# Patient Record
Sex: Female | Born: 1970 | Race: White | Hispanic: No | Marital: Married | Smoking: Never smoker
Health system: Southern US, Community
[De-identification: ages and names within clinical notes are randomized; demographics above are authoritative.]

## PROBLEM LIST (undated history)

## (undated) DIAGNOSIS — J189 Pneumonia, unspecified organism: Secondary | ICD-10-CM

## (undated) DIAGNOSIS — D649 Anemia, unspecified: Secondary | ICD-10-CM

## (undated) DIAGNOSIS — D219 Benign neoplasm of connective and other soft tissue, unspecified: Secondary | ICD-10-CM

## (undated) HISTORY — DX: Benign neoplasm of connective and other soft tissue, unspecified: D21.9

## (undated) HISTORY — DX: Anemia, unspecified: D64.9

## (undated) HISTORY — PX: LAPAROSCOPIC TUBAL LIGATION: SUR803

---

## 1996-01-17 ENCOUNTER — Encounter (INDEPENDENT_AMBULATORY_CARE_PROVIDER_SITE_OTHER): Payer: Self-pay | Admitting: *Deleted

## 1996-01-17 LAB — CONVERTED CEMR LAB

## 2000-03-11 ENCOUNTER — Encounter: Admission: RE | Admit: 2000-03-11 | Discharge: 2000-03-11 | Payer: Self-pay | Admitting: Family Medicine

## 2000-03-26 ENCOUNTER — Encounter: Admission: RE | Admit: 2000-03-26 | Discharge: 2000-03-26 | Payer: Self-pay | Admitting: Family Medicine

## 2000-04-02 ENCOUNTER — Encounter: Admission: RE | Admit: 2000-04-02 | Discharge: 2000-04-02 | Payer: Self-pay | Admitting: Family Medicine

## 2000-09-01 ENCOUNTER — Encounter: Admission: RE | Admit: 2000-09-01 | Discharge: 2000-09-01 | Payer: Self-pay | Admitting: Family Medicine

## 2000-09-16 ENCOUNTER — Encounter: Admission: RE | Admit: 2000-09-16 | Discharge: 2000-09-16 | Payer: Self-pay | Admitting: Family Medicine

## 2003-08-28 ENCOUNTER — Encounter: Payer: Self-pay | Admitting: Emergency Medicine

## 2003-08-28 ENCOUNTER — Emergency Department (HOSPITAL_COMMUNITY): Admission: EM | Admit: 2003-08-28 | Discharge: 2003-08-28 | Payer: Self-pay | Admitting: Emergency Medicine

## 2005-08-05 ENCOUNTER — Ambulatory Visit: Payer: Self-pay | Admitting: Family Medicine

## 2007-01-16 ENCOUNTER — Encounter (INDEPENDENT_AMBULATORY_CARE_PROVIDER_SITE_OTHER): Payer: Self-pay | Admitting: *Deleted

## 2007-11-19 ENCOUNTER — Encounter: Payer: Self-pay | Admitting: Family Medicine

## 2008-11-09 ENCOUNTER — Emergency Department (HOSPITAL_COMMUNITY): Admission: EM | Admit: 2008-11-09 | Discharge: 2008-11-09 | Payer: Self-pay | Admitting: Emergency Medicine

## 2010-12-08 ENCOUNTER — Encounter: Payer: Self-pay | Admitting: Family Medicine

## 2010-12-20 NOTE — Letter (Signed)
Summary: rpc chart  rpc chart   Imported By: Curtis Sites 08/20/2010 09:51:32  _____________________________________________________________________  External Attachment:    Type:   Image     Comment:   External Document

## 2011-07-30 ENCOUNTER — Encounter: Payer: Self-pay | Admitting: *Deleted

## 2011-07-30 ENCOUNTER — Emergency Department (HOSPITAL_COMMUNITY)
Admission: EM | Admit: 2011-07-30 | Discharge: 2011-07-30 | Discharge status: Against Medical Advice | Payer: Self-pay | Attending: Emergency Medicine | Admitting: Emergency Medicine

## 2011-07-30 DIAGNOSIS — R509 Fever, unspecified: Secondary | ICD-10-CM | POA: Insufficient documentation

## 2011-07-30 DIAGNOSIS — Z5320 Procedure and treatment not carried out because of patient's decision for unspecified reasons: Secondary | ICD-10-CM | POA: Insufficient documentation

## 2011-07-30 HISTORY — DX: Pneumonia, unspecified organism: J18.9

## 2011-07-30 NOTE — ED Notes (Signed)
Unable to locate pt  

## 2011-07-30 NOTE — ED Notes (Signed)
Alert, talking, body aches. No cough or sore throat, Has been taking aleve for aching, face flushed.  Chills intermittently today.

## 2011-07-30 NOTE — ED Notes (Signed)
Pt not in room.

## 2011-08-23 LAB — URINALYSIS, ROUTINE W REFLEX MICROSCOPIC
Bilirubin Urine: NEGATIVE
Glucose, UA: NEGATIVE mg/dL
Ketones, ur: NEGATIVE mg/dL
Leukocytes, UA: NEGATIVE
Nitrite: NEGATIVE
Protein, ur: NEGATIVE mg/dL
Specific Gravity, Urine: 1.01 (ref 1.005–1.030)
Urobilinogen, UA: 0.2 mg/dL (ref 0.0–1.0)
pH: 5 (ref 5.0–8.0)

## 2011-08-23 LAB — PREGNANCY, URINE: Preg Test, Ur: NEGATIVE

## 2011-08-23 LAB — URINE MICROSCOPIC-ADD ON

## 2011-11-19 DIAGNOSIS — J189 Pneumonia, unspecified organism: Secondary | ICD-10-CM

## 2011-11-19 HISTORY — DX: Pneumonia, unspecified organism: J18.9

## 2016-12-12 ENCOUNTER — Emergency Department (HOSPITAL_COMMUNITY): Payer: Self-pay

## 2016-12-12 ENCOUNTER — Emergency Department (HOSPITAL_COMMUNITY)
Admission: EM | Admit: 2016-12-12 | Discharge: 2016-12-12 | Disposition: A | Discharge status: Home / Self Care | Payer: Self-pay | Attending: Emergency Medicine | Admitting: Emergency Medicine

## 2016-12-12 ENCOUNTER — Encounter (HOSPITAL_COMMUNITY): Payer: Self-pay

## 2016-12-12 DIAGNOSIS — Y9389 Activity, other specified: Secondary | ICD-10-CM | POA: Insufficient documentation

## 2016-12-12 DIAGNOSIS — W0110XA Fall on same level from slipping, tripping and stumbling with subsequent striking against unspecified object, initial encounter: Secondary | ICD-10-CM | POA: Insufficient documentation

## 2016-12-12 DIAGNOSIS — Y92009 Unspecified place in unspecified non-institutional (private) residence as the place of occurrence of the external cause: Secondary | ICD-10-CM | POA: Insufficient documentation

## 2016-12-12 DIAGNOSIS — S0101XA Laceration without foreign body of scalp, initial encounter: Secondary | ICD-10-CM | POA: Insufficient documentation

## 2016-12-12 DIAGNOSIS — Z23 Encounter for immunization: Secondary | ICD-10-CM | POA: Insufficient documentation

## 2016-12-12 DIAGNOSIS — Y999 Unspecified external cause status: Secondary | ICD-10-CM | POA: Insufficient documentation

## 2016-12-12 DIAGNOSIS — S0990XA Unspecified injury of head, initial encounter: Secondary | ICD-10-CM

## 2016-12-12 DIAGNOSIS — Z791 Long term (current) use of non-steroidal anti-inflammatories (NSAID): Secondary | ICD-10-CM | POA: Insufficient documentation

## 2016-12-12 DIAGNOSIS — S060X1A Concussion with loss of consciousness of 30 minutes or less, initial encounter: Secondary | ICD-10-CM | POA: Insufficient documentation

## 2016-12-12 MED ORDER — LIDOCAINE HCL (PF) 1 % IJ SOLN
5.0000 mL | Freq: Once | INTRAMUSCULAR | Status: DC
  Filled 2016-12-12: qty 5

## 2016-12-12 MED ORDER — TETANUS-DIPHTH-ACELL PERTUSSIS 5-2.5-18.5 LF-MCG/0.5 IM SUSP
0.5000 mL | Freq: Once | INTRAMUSCULAR | Status: AC
  Administered 2016-12-12: 0.5 mL via INTRAMUSCULAR
  Filled 2016-12-12: qty 0.5

## 2016-12-12 MED ORDER — ONDANSETRON HCL 4 MG PO TABS: 4.0000 mg | ORAL_TABLET | Freq: Four times a day (QID) | ORAL | 0 refills | Status: DC

## 2016-12-12 NOTE — ED Provider Notes (Signed)
Emergency Department Provider Note   I have reviewed the triage vital signs and the nursing notes.   HISTORY  Chief Complaint Fall and Head Injury  HPI: Glenda Ray is a 46 y.o. female who presents to the Emergency Department complaining of a sudden-onset, head injury s/p mechanical fall that happened today. Patient states she was taking her trash can around the corner after cleaning it out, in which she slipped and fell into the trash can, the lid came flying. Patient reports hitting her head blacking out for a few seconds. patient states she went up to go to the house when her son noticed she was bleeding. Patient has associated wrist/knee pain, and dizziness. No modifying factors indicated. She denies any nausea or any other symptoms.    Past Medical History:  Diagnosis Date  . Pneumonia     There are no active problems to display for this patient.   Past Surgical History:  Procedure Laterality Date  . LAPAROSCOPIC TUBAL LIGATION        Allergies Amoxicillin and Vicodin [hydrocodone-acetaminophen]  No family history on file.  Social History Social History  Substance Use Topics  . Smoking status: Never Smoker  . Smokeless tobacco: Never Used  . Alcohol use No    Review of Systems Constitutional: No fever/chills Eyes: No visual changes. ENT: No sore throat. Cardiovascular: Denies chest pain. Respiratory: Denies shortness of breath. Gastrointestinal: No abdominal pain.  No nausea, no vomiting.  No diarrhea.  No constipation. Genitourinary: Negative for dysuria. Musculoskeletal: Negative for back pain. Skin: Negative for rash. Neurological: Negative for headaches, focal weakness or numbness. Positive for dizziness.   10-point ROS otherwise negative.  ____________________________________________   PHYSICAL EXAM:  VITAL SIGNS: ED Triage Vitals  Enc Vitals Group     BP 12/12/16 1307 142/76     Pulse Rate 12/12/16 1307 99     Resp 12/12/16 1307 18       Temp 12/12/16 1307 98.2 F (36.8 C)     Temp Source 12/12/16 1307 Oral     SpO2 12/12/16 1307 100 %     Weight 12/12/16 1304 153 lb (69.4 kg)     Height 12/12/16 1304 5\' 2"  (1.575 m)     Pain Score 12/12/16 1304 2    Constitutional: Alert and oriented. Well appearing and in no acute distress. Eyes: Conjunctivae are normal. PERRL. EOMI. Head: 3 cm laceration to right forehead in the hairline. Ears:  Healthy appearing ear canals and TMs bilaterally Nose: No congestion/rhinnorhea. Mouth/Throat: Mucous membranes are moist.  Oropharynx non-erythematous. Neck: No stridor. No cervical spine tenderness to palpation. Cardiovascular: Normal rate, regular rhythm. Good peripheral circulation. Grossly normal heart sounds.   Respiratory: Normal respiratory effort.  No retractions. Lungs CTAB. Gastrointestinal: Soft and nontender. No distention.  Musculoskeletal: No lower extremity tenderness nor edema. No gross deformities of extremities. Neurologic:  Normal speech and language. No gross focal neurologic deficits are appreciated.  Skin:  Skin is warm, dry and intact. No rash noted. Psychiatric: Mood and affect are normal. Speech and behavior are normal.  ____________________________________________  RADIOLOGY  Ct Head Wo Contrast  Result Date: 12/12/2016 CLINICAL DATA:  Fall, hit head. EXAM: CT HEAD WITHOUT CONTRAST TECHNIQUE: Contiguous axial images were obtained from the base of the skull through the vertex without intravenous contrast. COMPARISON:  None. FINDINGS: Brain: No acute intracranial abnormality. Specifically, no hemorrhage, hydrocephalus, mass lesion, acute infarction, or significant intracranial injury. Vascular: No hyperdense vessel or unexpected calcification. Skull: No acute  calvarial abnormality. Sinuses/Orbits: Visualized paranasal sinuses and mastoids clear. Orbital soft tissues unremarkable. Other: None IMPRESSION: Normal study. Electronically Signed   By: Rolm Baptise M.D.    On: 12/12/2016 14:18    ____________________________________________   PROCEDURES  Procedure(s) performed:   Marland KitchenMarland KitchenLaceration Repair Date/Time: 12/12/2016 9:34 PM Performed by: Girolamo Lortie, Wonda Olds Authorized by: Margette Fast   Consent:    Consent obtained:  Verbal   Consent given by:  Patient   Risks discussed:  Infection, pain, retained foreign body, poor cosmetic result, poor wound healing, need for additional repair, nerve damage and vascular damage   Alternatives discussed:  No treatment Anesthesia (see MAR for exact dosages):    Anesthesia method:  Local infiltration   Local anesthetic:  Lidocaine 1% w/o epi Laceration details:    Location:  Scalp   Scalp location:  R temporal   Length (cm):  3 Repair type:    Repair type:  Simple Pre-procedure details:    Preparation:  Patient was prepped and draped in usual sterile fashion Exploration:    Hemostasis achieved with:  Direct pressure   Wound exploration: entire depth of wound probed and visualized     Wound extent: no foreign bodies/material noted, no muscle damage noted, no nerve damage noted, no underlying fracture noted and no vascular damage noted     Contaminated: no   Treatment:    Area cleansed with:  Betadine   Amount of cleaning:  Standard   Irrigation solution:  Sterile saline   Visualized foreign bodies/material removed: no   Skin repair:    Repair method:  Staples   Number of staples:  2 Approximation:    Approximation:  Close   Vermilion border: well-aligned   Post-procedure details:    Dressing:  Open (no dressing)   Patient tolerance of procedure:  Tolerated well, no immediate complications    ____________________________________________   INITIAL IMPRESSION / ASSESSMENT AND PLAN / ED COURSE  Pertinent labs & imaging results that were available during my care of the patient were reviewed by me and considered in my medical decision making (see chart for details).  Patient resents to the  emergency department for evaluation of mechanical fall at home. She struck her head and a brief period of LOC. Initially had some pain in the right wrist, elbow, knee but these have resolved. The patient has full range of motion of the extremities with no obvious deformity. Plan for CT of the head. She also has a laceration to the right side of the scalp which may require suture/staples. Will update tetanus. Patient with no focal neurological deficits or clinical signs or symptoms to suggest basilar skull fracture.  Normal CT head. Laceration repair as above. Updated tetanus. Discharge home with concussion care plan at home and return precautions. Will return in 7 days for suture removal.   At this time, I do not feel there is any life-threatening condition present. I have reviewed and discussed all results (EKG, imaging, lab, urine as appropriate), exam findings with patient. I have reviewed nursing notes and appropriate previous records.  I feel the patient is safe to be discharged home without further emergent workup. Discussed usual and customary return precautions. Patient and family (if present) verbalize understanding and are comfortable with this plan.  Patient will follow-up with their primary care provider. If they do not have a primary care provider, information for follow-up has been provided to them. All questions have been answered.  ____________________________________________  FINAL CLINICAL IMPRESSION(S) /  ED DIAGNOSES  Final diagnoses:  Injury of head, initial encounter  Concussion with loss of consciousness of 30 minutes or less, initial encounter     MEDICATIONS GIVEN DURING THIS VISIT:  Medications  Tdap (BOOSTRIX) injection 0.5 mL (0.5 mLs Intramuscular Given 12/12/16 1532)     NEW OUTPATIENT MEDICATIONS STARTED DURING THIS VISIT:  Discharge Medication List as of 12/12/2016  3:31 PM    START taking these medications   Details  ondansetron (ZOFRAN) 4 MG tablet Take 1  tablet (4 mg total) by mouth every 6 (six) hours., Starting Thu 12/12/2016, Print         Note:  This document was prepared using Dragon voice recognition software and may include unintentional dictation errors.  Nanda Quinton, MD Emergency Medicine  By signing my name below, I, Collene Leyden, attest that this documentation has been prepared under the direction and in the presence of Margette Fast, MD. Electronically Signed: Collene Leyden, Scribe. 12/12/16. 1:34 PM.  I personally performed the services described in this documentation, which was scribed in my presence. The recorded information has been reviewed and is accurate.      Margette Fast, MD 12/12/16 2136

## 2016-12-12 NOTE — Discharge Instructions (Signed)

## 2016-12-12 NOTE — ED Triage Notes (Signed)
Pt reports she fell pulling her trash can around and fell into her trash can.  Pt says she hit her head and thinks may have blacked out for  A few seconds.  Also reports pain in r wrist and r knee.  Pt presently alert and oriented, c/o feeling dizzy.  Pt has small laceration to r side of head.

## 2016-12-18 ENCOUNTER — Emergency Department (HOSPITAL_COMMUNITY)
Admission: EM | Admit: 2016-12-18 | Discharge: 2016-12-18 | Disposition: A | Discharge status: Home / Self Care | Payer: Self-pay | Attending: Emergency Medicine | Admitting: Emergency Medicine

## 2016-12-18 ENCOUNTER — Encounter (HOSPITAL_COMMUNITY): Payer: Self-pay | Admitting: *Deleted

## 2016-12-18 DIAGNOSIS — X58XXXD Exposure to other specified factors, subsequent encounter: Secondary | ICD-10-CM | POA: Insufficient documentation

## 2016-12-18 DIAGNOSIS — S0101XD Laceration without foreign body of scalp, subsequent encounter: Secondary | ICD-10-CM | POA: Insufficient documentation

## 2016-12-18 DIAGNOSIS — Z4802 Encounter for removal of sutures: Secondary | ICD-10-CM | POA: Insufficient documentation

## 2016-12-18 NOTE — ED Provider Notes (Signed)
[facility] DEPT Provider Note   CSN: [id number] Arrival date & time: 12/18/16  0509  Time seen 05:40 AM   History   Chief Complaint Chief Complaint  Patient presents with  . Suture / Staple Removal    HPI Glenda Ray is a 46 y.o. female.  HPI  patient was seen on 125 and had 2 staples placed in her scalp. She is here today early. She feels like one of staples is loose and she wants to have them removed. She states she's not having any headaches and she is doing well.  PCP none  Past Medical History:  Diagnosis Date  . Pneumonia     There are no active problems to display for this patient.   Past Surgical History:  Procedure Laterality Date  . LAPAROSCOPIC TUBAL LIGATION      OB History    No data available       Home Medications    Prior to Admission medications   Medication Sig Start Date End Date Taking? Authorizing Provider  ibuprofen (ADVIL,MOTRIN) 200 MG tablet Take 200 mg by mouth every 6 (six) hours as needed.    Historical Provider, MD  ondansetron (ZOFRAN) 4 MG tablet Take 1 tablet (4 mg total) by mouth every 6 (six) hours. 12/12/16   Margette Fast, MD    Family History History reviewed. No pertinent family history.  Social History Social History  Substance Use Topics  . Smoking status: Never Smoker  . Smokeless tobacco: Never Used  . Alcohol use No     Allergies   Amoxicillin and Vicodin [hydrocodone-acetaminophen]   Review of Systems Review of Systems  Neurological: Negative for headaches.     Physical Exam Updated Vital Signs BP 125/75 (BP Location: Right Arm)   Pulse 107   Temp 98.1 F (36.7 C) (Oral)   Resp 20   Ht 5\' 2"  (1.575 m)   Wt 153 lb (69.4 kg)   LMP 12/11/2016   SpO2 99%   BMI 27.98 kg/m   Vital signs normal except for tachycardia   Physical Exam  Constitutional: She is oriented to person, place, and time. She appears well-developed and well-nourished.  HENT:  Head: Normocephalic and atraumatic.    Right Ear: External ear normal.  Left Ear: External ear normal.  Mouth/Throat: Oropharynx is clear and moist.  Eyes: Conjunctivae and EOM are normal.  Neck: Normal range of motion.  Cardiovascular: Normal rate.   Pulmonary/Chest: Effort normal. No respiratory distress.  Musculoskeletal: Normal range of motion.  Neurological: She is alert and oriented to person, place, and time.  Skin: Skin is warm and dry.  Healing laceration in the right upper scalp. The staples are not loose. The more medial aspect of the laceration is slightly gapped, not bleeding.      ED Treatments / Results   Procedures Procedures (including critical care time)  Medications Ordered in ED Medications - No data to display   Initial Impression / Assessment and Plan / ED Course  I have reviewed the triage vital signs and the nursing notes.  Pertinent labs & imaging results that were available during my care of the patient were reviewed by me and considered in my medical decision making (see chart for details).     Staples were removed per patient request.   Final Clinical Impressions(s) / ED Diagnoses   Final diagnoses:  Removal of staples    Plan discharge  Rolland Porter, MD, Abram Sander     Rolland Porter, MD  12/18/16 0548  

## 2016-12-18 NOTE — Discharge Instructions (Signed)
Be careful, the staples were removed a little early per your request. Recheck if it gets infected or comes open.

## 2016-12-18 NOTE — ED Triage Notes (Signed)
Pt here for staple removal  

## 2021-01-16 ENCOUNTER — Other Ambulatory Visit: Payer: Self-pay

## 2021-01-16 ENCOUNTER — Observation Stay (HOSPITAL_COMMUNITY)
Admission: EM | Admit: 2021-01-16 | Discharge: 2021-01-17 | Disposition: A | Discharge status: Home / Self Care | Payer: Self-pay | Attending: Internal Medicine | Admitting: Internal Medicine

## 2021-01-16 ENCOUNTER — Observation Stay (HOSPITAL_COMMUNITY): Payer: Self-pay

## 2021-01-16 ENCOUNTER — Encounter (HOSPITAL_COMMUNITY): Payer: Self-pay | Admitting: Emergency Medicine

## 2021-01-16 ENCOUNTER — Emergency Department (HOSPITAL_COMMUNITY): Payer: Self-pay

## 2021-01-16 DIAGNOSIS — N938 Other specified abnormal uterine and vaginal bleeding: Secondary | ICD-10-CM

## 2021-01-16 DIAGNOSIS — F419 Anxiety disorder, unspecified: Secondary | ICD-10-CM

## 2021-01-16 DIAGNOSIS — D259 Leiomyoma of uterus, unspecified: Secondary | ICD-10-CM | POA: Insufficient documentation

## 2021-01-16 DIAGNOSIS — D649 Anemia, unspecified: Principal | ICD-10-CM | POA: Diagnosis present

## 2021-01-16 DIAGNOSIS — N920 Excessive and frequent menstruation with regular cycle: Secondary | ICD-10-CM

## 2021-01-16 DIAGNOSIS — Z20822 Contact with and (suspected) exposure to covid-19: Secondary | ICD-10-CM | POA: Insufficient documentation

## 2021-01-16 DIAGNOSIS — N939 Abnormal uterine and vaginal bleeding, unspecified: Secondary | ICD-10-CM | POA: Diagnosis present

## 2021-01-16 DIAGNOSIS — R002 Palpitations: Secondary | ICD-10-CM

## 2021-01-16 LAB — LIPID PANEL
Cholesterol: 150 mg/dL (ref 0–200)
HDL: 51 mg/dL (ref >40)
LDL Cholesterol: 90 mg/dL (ref 0–99)
Total CHOL/HDL Ratio: 2.9 RATIO
Triglycerides: 46 mg/dL (ref <150)
VLDL: 9 mg/dL (ref 0–40)

## 2021-01-16 LAB — CBC
HCT: 26.2 % — ABNORMAL LOW (ref 36.0–46.0)
Hemoglobin: 6.5 g/dL — CL (ref 12.0–15.0)
MCH: 15 pg — ABNORMAL LOW (ref 26.0–34.0)
MCHC: 24.8 g/dL — ABNORMAL LOW (ref 30.0–36.0)
MCV: 60.6 fL — ABNORMAL LOW (ref 80.0–100.0)
Platelets: 398 10*3/uL (ref 150–400)
RBC: 4.32 MIL/uL (ref 3.87–5.11)
RDW: 21.2 % — ABNORMAL HIGH (ref 11.5–15.5)
WBC: 7.2 10*3/uL (ref 4.0–10.5)
nRBC: 0 % (ref 0.0–0.2)

## 2021-01-16 LAB — TYPE AND SCREEN: Antibody Screen: NEGATIVE

## 2021-01-16 LAB — BASIC METABOLIC PANEL
Anion gap: 9 (ref 5–15)
BUN: 8 mg/dL (ref 6–20)
CO2: 23 mmol/L (ref 22–32)
Calcium: 8.5 mg/dL — ABNORMAL LOW (ref 8.9–10.3)
Chloride: 106 mmol/L (ref 98–111)
Creatinine, Ser: 0.51 mg/dL (ref 0.44–1.00)
GFR, Estimated: >60 mL/min (ref >60)
Glucose, Bld: 105 mg/dL — ABNORMAL HIGH (ref 70–99)
Potassium: 3.5 mmol/L (ref 3.5–5.1)
Sodium: 138 mmol/L (ref 135–145)

## 2021-01-16 LAB — POC OCCULT BLOOD, ED: Fecal Occult Bld: NEGATIVE

## 2021-01-16 LAB — ABO/RH: ABO/RH(D): A POS

## 2021-01-16 LAB — PREPARE RBC (CROSSMATCH)

## 2021-01-16 LAB — TSH: TSH: 3.963 u[IU]/mL (ref 0.350–4.500)

## 2021-01-16 LAB — TROPONIN I (HIGH SENSITIVITY): Troponin I (High Sensitivity): <2 ng/L (ref <18)

## 2021-01-16 LAB — MAGNESIUM: Magnesium: 2.1 mg/dL (ref 1.7–2.4)

## 2021-01-16 MED ORDER — ONDANSETRON HCL 4 MG PO TABS
4.0000 mg | ORAL_TABLET | Freq: Four times a day (QID) | ORAL | Status: DC | PRN
  Administered 2021-01-17: 4 mg via ORAL
  Filled 2021-01-16: qty 1

## 2021-01-16 MED ORDER — ACETAMINOPHEN 650 MG RE SUPP: 650.0000 mg | Freq: Four times a day (QID) | RECTAL | Status: DC | PRN

## 2021-01-16 MED ORDER — MELATONIN 3 MG PO TABS
6.0000 mg | ORAL_TABLET | Freq: Every day | ORAL | Status: DC
  Administered 2021-01-16: 6 mg via ORAL
  Filled 2021-01-16: qty 2

## 2021-01-16 MED ORDER — SODIUM CHLORIDE 0.9% IV SOLUTION: Freq: Once | INTRAVENOUS | Status: AC

## 2021-01-16 MED ORDER — ONDANSETRON HCL 4 MG/2ML IJ SOLN: 4.0000 mg | Freq: Four times a day (QID) | INTRAMUSCULAR | Status: DC | PRN

## 2021-01-16 MED ORDER — POLYETHYLENE GLYCOL 3350 17 G PO PACK
17.0000 g | PACK | Freq: Every day | ORAL | Status: DC | PRN
Start: 2021-01-16 — End: 2021-01-17

## 2021-01-16 MED ORDER — LACTATED RINGERS IV SOLN: INTRAVENOUS | Status: DC

## 2021-01-16 MED ORDER — BISACODYL 5 MG PO TBEC: 5.0000 mg | DELAYED_RELEASE_TABLET | Freq: Every day | ORAL | Status: DC | PRN

## 2021-01-16 MED ORDER — ACETAMINOPHEN 325 MG PO TABS: 650.0000 mg | ORAL_TABLET | Freq: Four times a day (QID) | ORAL | Status: DC | PRN

## 2021-01-16 NOTE — ED Notes (Signed)
Date and time results received: 01/16/21 (1323) Test: Hemoglobin  Critical Value: 6.5 Name of Provider Notified: Dr. Roderic Palau Orders Received? Or Actions Taken?: Actions Taken: N/A

## 2021-01-16 NOTE — ED Provider Notes (Addendum)
[facility] EMERGENCY DEPARTMENT Provider Note   CSN: [id number] Arrival date & time: 01/16/21  1054     History Chief Complaint  Patient presents with   Palpitations    Glenda Ray is a 50 y.o. female with no significant past medical history presenting with anxiety and tachycardia.  She endorses having increased home stressors over the past several weeks (immediate family and inlaws)  with intermittent episodes of palpitations, reduced exercise tolerance and generalized fatigue. She developed constant tachycardia last night and has not resolved despite rest and attempts to calm herself.  She reports a distant history of similar symptoms ( about 25 years ago) and was diagnosed with anxiety.  She denies chest pain, lower leg swelling.  She denies n/v, unexplained weight gain or loss, no fevers or chills but became diaphoretic last night when the palpitations became more persistent.  She has found no alleviators for her symptoms.     HPI     Past Medical History:  Diagnosis Date   Pneumonia     There are no problems to display for this patient.   Past Surgical History:  Procedure Laterality Date   LAPAROSCOPIC TUBAL LIGATION       OB History   No obstetric history on file.     No family history on file.  Social History   Tobacco Use   Smoking status: Never Smoker   Smokeless tobacco: Never Used  Substance Use Topics   Alcohol use: No   Drug use: No    Home Medications Prior to Admission medications   Medication Sig Start Date End Date Taking? Authorizing Provider  bismuth subsalicylate (PEPTO BISMOL) 262 MG chewable tablet Chew 262 mg by mouth as needed for indigestion.   Yes [provider]  Famotidine (PEPCID PO) Take 1 tablet by mouth daily as needed (acid reflux).   Yes [provider]  ibuprofen (ADVIL,MOTRIN) 200 MG tablet Take 200 mg by mouth every 6 (six) hours as needed for fever, headache or mild pain.   Yes [provider]  ondansetron (ZOFRAN) 4 MG tablet Take 1 tablet (4 mg total) by mouth every 6 (six) hours. Patient not taking: Reported on 01/16/2021 12/12/16   Long, Wonda Olds, MD    Allergies    Amoxicillin and Vicodin [hydrocodone-acetaminophen]  Review of Systems   Review of Systems  Constitutional: Positive for fatigue. Negative for appetite change and fever.  HENT: Negative for congestion and sore throat.   Eyes: Negative.   Respiratory: Negative for chest tightness and shortness of breath.   Cardiovascular: Positive for palpitations. Negative for chest pain.  Gastrointestinal: Negative for abdominal pain and nausea.  Genitourinary: Positive for menstrual problem.  Musculoskeletal: Negative for arthralgias, joint swelling and neck pain.  Skin: Negative.  Negative for rash and wound.  Neurological: Negative for dizziness, weakness, light-headedness, numbness and headaches.  Psychiatric/Behavioral: The patient is nervous/anxious.     Physical Exam Updated Vital Signs BP (!) 124/91 (BP Location: Right Arm)    Pulse (!) 114    Temp 97.9 F (36.6 C) (Oral)    Resp (!) 22    Ht 5\' 2"  (1.575 m)    Wt 73.9 kg    LMP 01/02/2021    SpO2 100%    BMI 29.81 kg/m   Physical Exam Vitals and nursing note reviewed. Exam conducted with a chaperone present.  Constitutional:      Appearance: She is well-developed and well-nourished.  HENT:     Head:  Normocephalic and atraumatic.  Eyes:     Comments: Conjunctival pallor  Cardiovascular:     Rate and Rhythm: Regular rhythm. Tachycardia present.     Pulses: Intact distal pulses.     Heart sounds: Normal heart sounds.     Comments: Rare pvc  Pulmonary:     Effort: Pulmonary effort is normal.     Breath sounds: Normal breath sounds. No wheezing.  Abdominal:     General: Bowel sounds are normal. There is no distension.     Palpations: Abdomen is soft.     Tenderness: There is no abdominal tenderness.  Genitourinary:    Rectum: Guaiac result  negative.  Musculoskeletal:        General: Normal range of motion.     Cervical back: Normal range of motion.  Skin:    General: Skin is warm and dry.  Neurological:     Mental Status: She is alert.  Psychiatric:        Mood and Affect: Mood and affect normal.     ED Results / Procedures / Treatments   Labs (all labs ordered are listed, but only abnormal results are displayed) Labs Reviewed  CBC - Abnormal; Notable for the following components:      Result Value   Hemoglobin 6.5 (*)    HCT 26.2 (*)    MCV 60.6 (*)    MCH 15.0 (*)    MCHC 24.8 (*)    RDW 21.2 (*)    All other components within normal limits  BASIC METABOLIC PANEL - Abnormal; Notable for the following components:   Glucose, Bld 105 (*)    Calcium 8.5 (*)    All other components within normal limits  SARS CORONAVIRUS 2 (TAT 6-24 HRS)  MAGNESIUM  TSH  OCCULT BLOOD X 1 CARD TO LAB, STOOL  POC OCCULT BLOOD, ED  TYPE AND SCREEN  ANTIBODY SCREEN  PREPARE RBC (CROSSMATCH)  TROPONIN I (HIGH SENSITIVITY)    EKG None  Radiology DG Chest Portable 1 View  Result Date: 01/16/2021 CLINICAL DATA:  Palpitations EXAM: PORTABLE CHEST 1 VIEW COMPARISON:  None. FINDINGS: The heart size and mediastinal contours are within normal limits. Both lungs are clear. No pleural effusion or pneumothorax. The visualized skeletal structures are unremarkable. IMPRESSION: No acute process in the chest. Electronically Signed   By: Macy Mis M.D.   On: 01/16/2021 12:41    Procedures Procedures    CRITICAL CARE Performed by: Evalee Jefferson Total critical care time: 45 minutes Critical care time was exclusive of separately billable procedures and treating other patients. Critical care was necessary to treat or prevent imminent or life-threatening deterioration. Critical care was time spent personally by me on the following activities: development of treatment plan with patient and/or surrogate as well as nursing, discussions with  consultants, evaluation of patient's response to treatment, examination of patient, obtaining history from patient or surrogate, ordering and performing treatments and interventions, ordering and review of laboratory studies, ordering and review of radiographic studies, pulse oximetry and re-evaluation of patient's condition.   Medications Ordered in ED Medications  0.9 %  sodium chloride infusion (Manually program via Guardrails IV Fluids) (has no administration in time range)    ED Course  I have reviewed the triage vital signs and the nursing notes.  Pertinent labs & imaging results that were available during my care of the patient were reviewed by me and considered in my medical decision making (see chart for details).    MDM  Rules/Calculators/A&P                          Labs reviewed and discussed with pt. Including significant microcytic anemia.  She endorses has had very heavy menses, 4 days duration and regular but heavy to the point of needing to wear Depends overnight which often is still not enough protection.  She does have have pcp or gyn care.  She is hemoccult negative.  Troponin x 1 normal - no evidence of demand ischemia.  Discussed need for transfusion for symptomatic anemia.  Pt is is agreeable to receive.  2 units prbc's ordered.   Call to hospitalists for admission. Discussed with Dr. Lorin Mercy who accepts pt for admission.   Final Clinical Impression(s) / ED Diagnoses Final diagnoses:  Symptomatic anemia  Menorrhagia with regular cycle  Palpitations  Anxiety    Rx / DC Orders ED Discharge Orders    None       Landis Martins 01/16/21 Hatch, Govani Radloff, PA-C 01/16/21 1548    Milton Ferguson, MD 01/18/21 0854    Evalee Jefferson, PA-C 01/18/21 1048    Milton Ferguson, MD 01/18/21 2230

## 2021-01-16 NOTE — ED Triage Notes (Signed)
Pt c/o palpitations since last night. Pt states that she has also been under a lot of stress.

## 2021-01-16 NOTE — H&P (Signed)
History and Physical    Glenda Ray GGY:[id number] DOB: 12/17/1970 DOA: 01/16/2021  PCP: Patient, No Pcp Per Consultants:  Blenda Nicely - ENT Patient coming from: Home - lives with husband, son, daughter; Donald Prose: Husband, [phone]  Chief Complaint: Palpitations  HPI: Glenda Ray is a 50 y.o. female with medical history significant of PNA presenting with palpitations.  She has been under a lot of stress for months, unbearable for a couple of weeks.  She has been dealing with medical issues with her son, issues with her car, more stressed than usual.  She hasn't felt herself.  Yesterday, it felt even worse and yesterday her MIL called and it just broke.  She felt like something snapped, her heart was feeling funny and she was having palpitations.  She slept maybe only an hour last night.  This AM, she went to UC in [place] [employer] and her heart rate was so bad she was sent to the ER.  For the last 1-2 years, she has had severe menses.  Her LMP was 2 weeks ago.  Periods last 4-5 days, for 2 of those days she gushes blood and uses towels and changes clothes and wears Depends.  She has severe cramping and bad clots.  Has not had a pap smear in many years.  She gets very depressed and anxious with her periods.  She does have hot flashes and mood swings.    ED Course:  Symptomatic anemia.  Heavy periods, Hgb 6.5.  Heme negative.  Here with palpitations.  Thought this was due to anxiety.  Denies CP/SOB but has had fatigue, reduced exercise tolerance.  Giving 2 units PRBC.  Review of Systems: As per HPI; otherwise review of systems reviewed and negative.   Ambulatory Status:  Ambulates without assistance  COVID Vaccine Status:  None  Past Medical History:  Diagnosis Date  . Pneumonia 2013    Past Surgical History:  Procedure Laterality Date  . LAPAROSCOPIC TUBAL LIGATION      Social History   Socioeconomic History  . Marital status: Married    Spouse name: Not on file  . Number of children: Not on  file  . Years of education: Not on file  . Highest education level: Not on file  Occupational History  . 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 Smoking status: Never Smoker  . Smokeless tobacco: Never Used  Substance and Sexual Activity  . Alcohol use: No  . Drug use: No  . Sexual activity: Yes    Birth control/protection: Surgical  Other Topics Concern  . Not on file  Social History Narrative  . Not on file   Social Determinants of Health   Financial Resource Strain: Not on file  Food Insecurity: Not on file  Transportation Needs: Not on file  Physical Activity: Not on file  Stress: Not on file  Social Connections: Not on file  Intimate Partner Violence: Not on file    Allergies  Allergen Reactions  . Amoxicillin   . Vicodin [Hydrocodone-Acetaminophen]     Family History  Problem Relation Age of Onset  . Dysfunctional uterine bleeding Mother     Prior to Admission medications   Medication Sig Start Date End Date Taking? Authorizing Provider  bismuth subsalicylate (PEPTO BISMOL) 262 MG chewable tablet Chew 262 mg by mouth as needed for indigestion.   Yes [provider]  Famotidine (PEPCID PO) Take 1 tablet by mouth daily as needed (acid reflux).   Yes [provider]  ibuprofen (ADVIL,MOTRIN) 200 MG tablet Take 200 mg by mouth every 6 (six) hours as needed for fever, headache or mild pain.   Yes [provider]  ondansetron (ZOFRAN) 4 MG tablet Take 1 tablet (4 mg total) by mouth every 6 (six) hours. Patient not taking: Reported on 01/16/2021 12/12/16   Margette Fast, MD    Physical Exam: Vitals:   01/16/21 1433 01/16/21 1605 01/16/21 1620 01/16/21 1700  BP: (!) 124/91 130/76 127/76 120/71  Pulse: (!) 114 89 90 87  Resp: (!) 22 16 15 12   Temp:  98.1 F (36.7 C) 98.1 F (36.7 C)   TempSrc:  Oral Oral   SpO2: 100% 100% 100% 100%  Weight:      Height:         . General:  Appears calm and comfortable and is in  NAD, she is somewhat anxious and mildly emotionally labile . Eyes:  PERRL, EOMI, normal lids, iris . ENT:  grossly normal hearing, lips & tongue, mmm . Neck:  no LAD, masses or thyromegaly . Cardiovascular:  RRR, no m/r/g. No LE edema.  Marland Kitchen Respiratory:   CTA bilaterally with no wheezes/rales/rhonchi.  Normal respiratory effort. . Abdomen:  soft, NT, ND, NABS . Skin:  no rash or induration seen on limited exam . Musculoskeletal:  grossly normal tone BUE/BLE, good ROM, no bony abnormality . Psychiatric:  anxious mood and affect, speech fluent and appropriate, AOx3 . Neurologic:  CN 2-12 grossly intact, moves all extremities in coordinated fashion    Radiological Exams on Admission: Independently reviewed - see discussion in A/P where applicable  DG Chest Portable 1 View  Result Date: 01/16/2021 CLINICAL DATA:  Palpitations EXAM: PORTABLE CHEST 1 VIEW COMPARISON:  None. FINDINGS: The heart size and mediastinal contours are within normal limits. Both lungs are clear. No pleural effusion or pneumothorax. The visualized skeletal structures are unremarkable. IMPRESSION: No acute process in the chest. Electronically Signed   By: Macy Mis M.D.   On: 01/16/2021 12:41    EKG: Independently reviewed.  Sinus tachycardia with rate 111; no evidence of acute ischemia   Labs on Admission: I have personally reviewed the available labs and imaging studies at the time of the admission.  Pertinent labs:   Glucose 105 HS troponin <2 WBC 7.2 Hgb 6.5 TSH 3.963 Heme negative   Assessment/Plan Principal Problem:   Symptomatic anemia Active Problems:   Abnormal uterine bleeding   Symptomatic anemia -Patient without prior need for transfusion presenting with palpitations and anxiety -Hgb 6.5 -MCV 60.6, RDW 21.2 -MCV is >100, macrocytic -MCV is <80 and so this is microcytic anemia -MCV is < 70 indicating probable iron deficiency -With an MCV <70, elevated RDW, and reason for iron deficiency  - iron deficiency can be presumed -Will need to be discharged with iron -This issue may be able to be resolved soon, pending GYN assistance -Normal TSH -Heme testing was negative in the ER -Will observe overnight in med surg bed. -Transfuse 2 units PRBC to start and recheck CBC in AM.  Abnormal Uterine Bleeding -Patient reports extraordinarily heavy menses for the last few years -This appears to be the most likely source of her anemia - although she has been lost to f/u and so needs complete physical and cancer screenings -Will order complete pelvic US for further evaluation -[facility] OB[facility] appointment tomorrow at 11:10AM!  Please d/c in time for her to make this appointment so that they can discuss her options (hysterectomy,  IUD, endometrial ablation, ?others). -She likely needs pap/pelvic done prior to further treatment  Health care maintenance -TOC team consult for PCP referral -Does not have insurance, may benefit from consideration of the [facility] financial assistance plan -Reports prior mammogram (not recently) - no evidence in Epic; needs mammo -Has never had colonoscopy; negative hemoccult in ER but needs colon cancer screening    Note: This patient has been tested and is pending for the novel coronavirus COVID-19. The patient has NOT been vaccinated against COVID-19.   Level of care: Med-Surg DVT prophylaxis: SCDs Code Status:  Full - confirmed with patient/family Family Communication: Son (with ?autism) was present throughout evaluation Disposition Plan:  The patient is from: home  Anticipated d/c is to: home without [facility] services   Anticipated d/c date will depend on clinical response to treatment, but possibly as early as tomorrow if she has excellent response to treatment  Patient is currently: acutely ill Consults called:  Discussed with OB/GYN by telephone at the time of admission; [place] team Admission status:  It is my clinical opinion that referral for OBSERVATION is  reasonable and necessary in this patient based on the above information provided. The aforementioned taken together are felt to place the patient at high risk for further clinical deterioration. However it is anticipated that the patient may be medically stable for discharge from the hospital within 24 to 48 hours.    Karmen Bongo MD [facility]   How to contact the [facility] Attending or Consulting provider [place] or covering provider during after hours [place], for this patient?  1. Check the care team in [facility] and look for a) attending/consulting [facility] provider listed and b) the [facility] team listed 2. Log into [email or web address] and use [place]'s universal password to access. If you do not have the password, please contact the hospital operator. 3. Locate the [facility] provider you are looking for under [facility] and page to a number that you can be directly reached. 4. If you still have difficulty reaching the provider, please page the [facility] (Director on Call) for the Hospitalists listed on amion for assistance.   01/16/2021, 5:18 PM

## 2021-01-16 NOTE — Progress Notes (Signed)
Pt arrived to room #324 via stretcher from ED. Oriented to room and safety procedures. Blood infusing without s/s infiltration. Husband at bedside. Pt denies c/o.

## 2021-01-17 ENCOUNTER — Telehealth: Payer: Self-pay

## 2021-01-17 ENCOUNTER — Observation Stay (HOSPITAL_COMMUNITY): Payer: Self-pay

## 2021-01-17 ENCOUNTER — Ambulatory Visit (INDEPENDENT_AMBULATORY_CARE_PROVIDER_SITE_OTHER): Payer: Self-pay | Admitting: Adult Health

## 2021-01-17 ENCOUNTER — Other Ambulatory Visit (HOSPITAL_COMMUNITY)
Admission: RE | Admit: 2021-01-17 | Discharge: 2021-01-17 | Disposition: A | Discharge status: Home / Self Care | Payer: Self-pay | Source: Ambulatory Visit | Attending: Adult Health | Admitting: Adult Health

## 2021-01-17 ENCOUNTER — Encounter: Payer: Self-pay | Admitting: Adult Health

## 2021-01-17 VITALS — BP 113/71 | HR 88 | Ht 61.0 in | Wt 164.0 lb

## 2021-01-17 DIAGNOSIS — Z124 Encounter for screening for malignant neoplasm of cervix: Secondary | ICD-10-CM | POA: Insufficient documentation

## 2021-01-17 DIAGNOSIS — D500 Iron deficiency anemia secondary to blood loss (chronic): Secondary | ICD-10-CM

## 2021-01-17 DIAGNOSIS — D219 Benign neoplasm of connective and other soft tissue, unspecified: Secondary | ICD-10-CM

## 2021-01-17 DIAGNOSIS — N920 Excessive and frequent menstruation with regular cycle: Secondary | ICD-10-CM

## 2021-01-17 DIAGNOSIS — N938 Other specified abnormal uterine and vaginal bleeding: Secondary | ICD-10-CM

## 2021-01-17 DIAGNOSIS — R002 Palpitations: Secondary | ICD-10-CM

## 2021-01-17 LAB — BPAM RBC
Blood Product Expiration Date: 202203302359
Blood Product Expiration Date: 202203302359
ISSUE DATE / TIME: 202203011557
ISSUE DATE / TIME: 202203011846
Unit Type and Rh: 6200
Unit Type and Rh: 6200

## 2021-01-17 LAB — CBC
HCT: 34.6 % — ABNORMAL LOW (ref 36.0–46.0)
Hemoglobin: 9.3 g/dL — ABNORMAL LOW (ref 12.0–15.0)
MCH: 18 pg — ABNORMAL LOW (ref 26.0–34.0)
MCHC: 26.9 g/dL — ABNORMAL LOW (ref 30.0–36.0)
MCV: 66.8 fL — ABNORMAL LOW (ref 80.0–100.0)
Platelets: 216 10*3/uL (ref 150–400)
RBC: 5.18 MIL/uL — ABNORMAL HIGH (ref 3.87–5.11)
RDW: 26.4 % — ABNORMAL HIGH (ref 11.5–15.5)
WBC: 6.8 10*3/uL (ref 4.0–10.5)
nRBC: 0 % (ref 0.0–0.2)

## 2021-01-17 LAB — TYPE AND SCREEN
ABO/RH(D): A POS
Unit division: 0
Unit division: 0

## 2021-01-17 LAB — BASIC METABOLIC PANEL
Anion gap: 11 (ref 5–15)
BUN: 12 mg/dL (ref 6–20)
CO2: 20 mmol/L — ABNORMAL LOW (ref 22–32)
Calcium: 8.6 mg/dL — ABNORMAL LOW (ref 8.9–10.3)
Chloride: 106 mmol/L (ref 98–111)
Creatinine, Ser: 0.52 mg/dL (ref 0.44–1.00)
GFR, Estimated: >60 mL/min (ref >60)
Glucose, Bld: 84 mg/dL (ref 70–99)
Potassium: 4.5 mmol/L (ref 3.5–5.1)
Sodium: 137 mmol/L (ref 135–145)

## 2021-01-17 LAB — HIV ANTIBODY (ROUTINE TESTING W REFLEX): HIV Screen 4th Generation wRfx: NONREACTIVE

## 2021-01-17 LAB — SARS CORONAVIRUS 2 (TAT 6-24 HRS): SARS Coronavirus 2: NEGATIVE

## 2021-01-17 LAB — HEMOGLOBIN A1C
Hgb A1c MFr Bld: 5.1 % (ref 4.8–5.6)
Mean Plasma Glucose: 99.67 mg/dL

## 2021-01-17 MED ORDER — ACETAMINOPHEN 325 MG PO TABS: 650.0000 mg | ORAL_TABLET | Freq: Four times a day (QID) | ORAL | 0 refills | Status: DC | PRN

## 2021-01-17 MED ORDER — PROMETHAZINE HCL 25 MG PO TABS: 25.0000 mg | ORAL_TABLET | Freq: Four times a day (QID) | ORAL | 1 refills | Status: DC | PRN

## 2021-01-17 MED ORDER — MEGESTROL ACETATE 40 MG PO TABS
ORAL_TABLET | ORAL | 1 refills | Status: DC
Start: 2021-01-17 — End: 2021-02-05

## 2021-01-17 MED ORDER — POLYETHYLENE GLYCOL 3350 17 G PO PACK: 17.0000 g | PACK | Freq: Every day | ORAL | 0 refills | Status: DC | PRN

## 2021-01-17 MED ORDER — NIFEREX PO TABS: 150.0000 mg | ORAL_TABLET | Freq: Every day | ORAL | 1 refills | Status: DC

## 2021-01-17 NOTE — Discharge Summary (Signed)
Physician Discharge Summary  Glenda Ray ZDG:[id number] DOB: 09/29/1971 DOA: 01/16/2021  PCP: Patient, No Pcp Per  Admit date: 01/16/2021 Discharge date: 01/17/2021  Time spent: 30 minutes  Recommendations for Outpatient Follow-up:  1. Repeat CBC to follow Hgb trend.   Discharge Diagnoses:  Principal Problem:   Symptomatic anemia Active Problems:   DUB (dysfunctional uterine bleeding)   Menorrhagia with regular cycle   Palpitations Overweight   Discharge Condition: Stable and improved.  Patient discharged home with instruction to follow-up with gynecology and to establish care with PCP.  CODE STATUS: Full code.  Diet recommendation: low calorie diet  Filed Weights   01/16/21 1103  Weight: 73.9 kg    History of present illness:  As per H&P written by Dr. Lorin Mercy on 01/16/2021 Glenda Ray is a 50 y.o. female with medical history significant of previous episode of PNA and GERD presenting with palpitations.  She has been under a lot of stress for months, unbearable for a couple of weeks.  She has been dealing with medical issues with her son, issues with her car, more stressed than usual.  She hasn't felt herself. The day prior to admission, She felt like something snapped, her heart was feeling funny and she was having palpitations.  She slept maybe only an hour last night.  This AM, she went to UC in [place] and her heart rate was so bad she was sent to the ER.  For the last 1-2 years, she has had severe menses.  Her LMP was 2 weeks ago.  Periods last 4-5 days, for 2 of those days she gushes blood and uses towels and changes clothes and wears Depends.  She has severe cramping and bad clots.  Has not had a pap smear in many years.  She gets very depressed and anxious with her periods.  She does have hot flashes and mood swings.  Hospital Course:  1-symptomatic anemia/acute blood loss anemia -MCV 60.6, RDW 21.2 -With an MCV less than 70 and an elevated RDW patient's anemia panel suggesting  iron deficiency anemia. -in the setting ov abnormal uterine bleeding. -normal TSH -2 units PRBC's transfused and Hgb 9.3 currently -asymptomatic at discharge -sent home on niferex daily.  2-Abnormal uterine bleeding -Transvaginal ultrasound demonstrating large fibroid -OB/GYN outpatient follow up arranged and will follow their recommendations. -repeat CBC in 1 week to follow Hgb trend  3-GERD -continue famotidine   4-Health care maintenance  -outpatient follow up with PCP to establish care and follow up preventive care.  5-Overweight  -low calorie diet and increase physical activity.  Procedures: See below for x-ray reports.  Consultations: None   Discharge Exam: Vitals:   01/17/21 0550 01/17/21 1006  BP: 131/71 123/81  Pulse: 77 (!) 103  Resp: 20 16  Temp: 98.4 F (36.9 C) 98.5 F (36.9 C)  SpO2: 99% 99%    General: Afebrile, no chest pain, no nausea, no vomiting.  Patient reports feeling significantly better and denies any dizziness or lightheadedness symptoms at this time. Cardiovascular: S1 and S2 appreciated; rate controlled, no rubs, no gallops, no murmurs, no JVD.  Respiratory: Clear to auscultation bilaterally; no using accessory muscle.  Good oxygen saturation on room air. Abdomen: Soft, nontender, nondistended, positive bowel sounds Extremities: No cyanosis or clubbing.  Discharge Instructions   Discharge Instructions    Diet general   Complete by: As directed    Discharge instructions   Complete by: As directed    Take medications as prescribed Maintain adequate  hydration  Follow-up with OB/GYN as instructed Please establish care with a primary care physician and follow up recommendations for preventive care. Follow low calorie diet and increase physical activity.     Allergies as of 01/17/2021      Reactions   Amoxicillin    Vicodin [hydrocodone-acetaminophen]       Medication List    STOP taking these medications   ibuprofen 200 MG  tablet Commonly known as: ADVIL     TAKE these medications   acetaminophen 325 MG tablet Commonly known as: TYLENOL Take 2 tablets (650 mg total) by mouth every 6 (six) hours as needed for mild pain or headache (or Fever >/= 101).   bismuth subsalicylate 619 MG chewable tablet Commonly known as: PEPTO BISMOL Chew 262 mg by mouth as needed for indigestion.   Niferex Tabs Take 150 mg by mouth daily.   PEPCID PO Take 1 tablet by mouth daily as needed (acid reflux).   polyethylene glycol 17 g packet Commonly known as: MIRALAX / GLYCOLAX Take 17 g by mouth daily as needed for mild constipation.      Allergies  Allergen Reactions  . Amoxicillin   . Vicodin [Hydrocodone-Acetaminophen]     Follow-up Information    [facility]. Go on 01/17/2021.   Specialty: Obstetrics and Gynecology Why: 11:10 am Contact information: [address] [place] [phone]              The results of significant diagnostics from this hospitalization (including imaging, microbiology, ancillary and laboratory) are listed below for reference.    Significant Diagnostic Studies: DG Chest Portable 1 View  Result Date: 01/16/2021 CLINICAL DATA:  Palpitations EXAM: PORTABLE CHEST 1 VIEW COMPARISON:  None. FINDINGS: The heart size and mediastinal contours are within normal limits. Both lungs are clear. No pleural effusion or pneumothorax. The visualized skeletal structures are unremarkable. IMPRESSION: No acute process in the chest. Electronically Signed   By: Macy Mis M.D.   On: 01/16/2021 12:41   US PELVIC COMPLETE WITH TRANSVAGINAL  Result Date: 01/17/2021 CLINICAL DATA:  Dysfunctional uterine bleeding. EXAM: TRANSABDOMINAL AND TRANSVAGINAL ULTRASOUND OF PELVIS TECHNIQUE: Both transabdominal and transvaginal ultrasound examinations of the pelvis were performed. Transabdominal technique was performed for global imaging of the pelvis including uterus,  ovaries, adnexal regions, and pelvic cul-de-sac. It was necessary to proceed with endovaginal exam following the transabdominal exam to visualize the uterus and ovaries. COMPARISON:  No prior. FINDINGS: Uterus Measurements: 14.4 x 9.9 x 14.5 cm = volume: 1090.7 mL. A 7.6 and a 6.1 cm fibroid noted. Endometrium Thickness: Not visualized.  No focal abnormality visualized. Right ovary Measurements: 4.0 x 2.0 x 3.0 cm = volume: 12.7 mL. Limited visualization. No focal abnormality identified. Left ovary Measurements: Not visualized. Other findings No abnormal free fluid. Exam limited due to bowel gas and large fibroids. IMPRESSION: 1.  Large fibroids as above. 2. Limited exam due to bowel gas and large fibroids. Endometrium and left ovary not visualized. Limited visualization right ovary. No free fluid. Electronically Signed   By: Marcello Moores  Register   On: 01/17/2021 10:02    Microbiology: Recent Results (from the past 240 hour(s))  SARS CORONAVIRUS 2 (TAT 6-24 HRS) Nasopharyngeal Nasopharyngeal Swab     Status: None   Collection Time: 01/16/21  3:05 PM   Specimen: Nasopharyngeal Swab  Result Value Ref Range Status   SARS Coronavirus 2 NEGATIVE NEGATIVE Final    Comment: (NOTE) SARS-CoV-2 target nucleic acids  are NOT DETECTED.  The SARS-CoV-2 RNA is generally detectable in upper and lower respiratory specimens during the acute phase of infection. Negative results do not preclude SARS-CoV-2 infection, do not rule out co-infections with other pathogens, and should not be used as the sole basis for treatment or other patient management decisions. Negative results must be combined with clinical observations, patient history, and epidemiological information. The expected result is Negative.  Fact Sheet for Patients: SugarRoll.be  Fact Sheet for Healthcare Providers: https://www.woods-mathews.com/  This test is not yet approved or cleared by the [place] FDA  and  has been authorized for detection and/or diagnosis of SARS-CoV-2 by FDA under an Emergency Use Authorization (EUA). This EUA will remain  in effect (meaning this test can be used) for the duration of the COVID-19 declaration under Se ction 564(b)(1) of the Act, 21 U.S.C. section 360bbb-3(b)(1), unless the authorization is terminated or revoked sooner.  Performed at [facility], [place] [address], [place], [place] [address]      Labs: Basic Metabolic Panel: Recent Labs  Lab 01/16/21 1228 01/17/21 0505  NA 138 137  K 3.5 4.5  CL 106 106  CO2 23 20*  GLUCOSE 105* 84  BUN 8 12  CREATININE 0.51 0.52  CALCIUM 8.5* 8.6*  MG 2.1  --    CBC: Recent Labs  Lab 01/16/21 1228 01/17/21 0505  WBC 7.2 6.8  HGB 6.5* 9.3*  HCT 26.2* 34.6*  MCV 60.6* 66.8*  PLT 398 216   Signed:  Barton Dubois MD.  [facility] 01/17/2021, 10:14 AM

## 2021-01-17 NOTE — Telephone Encounter (Signed)
Received referral from [place] LCSW at [facility] to Care Glenda Ray for PCP needs.  Call to client made. Identified reason for call and identified program. Person answering phone states Glenda Ray was not currently home. States when she arrives home they would give her the message to call [phone] [facility].  Plan: If patient does not return call today, will attempt to reach by phone again on 01/18/21.  [place] [employer]

## 2021-01-17 NOTE — Clinical Social Work Note (Signed)
Patient referred to [facility] for ongoing PCP care via in basket message to Mayra Reel.    Antonino Nienhuis, Clydene Pugh, LCSW

## 2021-01-17 NOTE — Progress Notes (Signed)
Patient states understanding of discharge instructions.  

## 2021-01-17 NOTE — Progress Notes (Signed)
Pt discharged via WC to POV with all personal belongings in her possession. 

## 2021-01-17 NOTE — Telephone Encounter (Signed)
Ms Fretwell returned phone call and discussed [facility] program and what our program offers. Discussed documents that would be needed for enrollment. Ms. Mcclane wishes to think tonight about it and will call back tomorrow when she has rested more to get more information about financial requirements and our services.  She is very appreciative of call.  Provided support and encouraged her to rest tonight. She was given main line number to [facility] [phone] and hours of operation.   Debria Garret RN [place] [employer]

## 2021-01-17 NOTE — Progress Notes (Signed)
Patient ID: Glenda Ray, female   DOB: 1970-12-25, 50 y.o.   MRN: [id number] History of Present Illness: Glenda Ray is a 50 year old white female, married, G2P2, snet over from ER. She was seen and found to have Hgb of 6.5 and was feeling bad and had palpitations, she received 2 units PRBCs and feels much better Hgb is now 9.3. She had an [place] that showed large fibroids, largest 7.6 cm and 6.1 cm and uterus measures 14.4 x 9.9 x 14.5 cm with volume 1090.7 ml. 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  Current Medications, Allergies, Past Medical History, Past Surgical History, Family History and Social History were reviewed in [employer] record.     Review of Systems: +heavy periods for 4-5 days, wears depends +cramping +palpitations queasy feeling    Physical Exam:BP 113/71 (BP Location: Left Arm, Patient Position: Sitting, Cuff Size: Normal)   Pulse 88   Ht 5\' 1"  (1.549 m)   Wt 164 lb (74.4 kg)   LMP 01/02/2021 (Approximate)   BMI 30.99 kg/m  General:  Well developed, well nourished, no acute distress Skin:  Warm and dry Neck:  Midline trachea, normal thyroid, good ROM, no lymphadenopathy Lungs; Clear to auscultation bilaterally Cardiovascular: Regular rate and rhythm Pelvic:  External genitalia is normal in appearance, no lesions.  The vagina is normal in appearance. Urethra has no lesions or masses. The cervix is smooth, pap with GC/CHL and HR HPV genotyping performed.  Uterus is felt to be about 18 week size and tender.  No adnexal masses or tenderness noted.Bladder is non tender, no masses felt. Psych:  No mood changes, alert and cooperative,seems anxious Examination chaperoned by Glenda Pupa LPN AA is 0 Fall risk is low PHQ 9 score is 1 GAD score is 7  [place] - 01/17/21 1119      Pregnancy Intention Screening   Does the patient want to become pregnant in the next year? No    Does the patient's partner want to become pregnant in the next year? No     Would the patient like to discuss contraceptive options today? No      Contraception Wrap Up   Current Method Female Sterilization    End Method Female Sterilization    Contraception Counseling Provided No          Impression and plan: 1. Routine cervical smear Pap sent  2. Menorrhagia with regular cycle Will rx megace to stop periods for now  Meds ordered this encounter  Medications  . megestrol (MEGACE) 40 MG tablet    Sig: Take 3 daily at same time    Dispense:  90 tablet    Refill:  1    Order Specific Question:   Supervising Provider    Answer:   Elonda Husky, LUTHER H [[id number]]  . promethazine (PHENERGAN) 25 MG tablet    Sig: Take 1 tablet (25 mg total) by mouth every 6 (six) hours as needed for nausea or vomiting.    Dispense:  30 tablet    Refill:  1    Order Specific Question:   Supervising Provider    Answer:   Elonda Husky, LUTHER H [[id number]]    3. Iron deficiency anemia due to chronic blood loss Take iron from ER and miralax Eat iron rich foods and rest today Push fluids   4. Fibroids Return 01/26/21 to talk with Dr Elonda Husky about hysterectomy  Review handout on fibroids and hysterectomy

## 2021-01-19 ENCOUNTER — Encounter (HOSPITAL_COMMUNITY): Payer: Self-pay | Admitting: Emergency Medicine

## 2021-01-19 ENCOUNTER — Other Ambulatory Visit: Payer: Self-pay

## 2021-01-19 ENCOUNTER — Emergency Department (HOSPITAL_COMMUNITY)
Admission: EM | Admit: 2021-01-19 | Discharge: 2021-01-20 | Disposition: A | Discharge status: Home / Self Care | Payer: Self-pay | Attending: Emergency Medicine | Admitting: Emergency Medicine

## 2021-01-19 DIAGNOSIS — M79601 Pain in right arm: Secondary | ICD-10-CM | POA: Insufficient documentation

## 2021-01-19 LAB — CYTOLOGY - PAP
Adequacy: ABSENT
Chlamydia: NEGATIVE
Comment: NEGATIVE
Comment: NEGATIVE
Comment: NORMAL
Diagnosis: NEGATIVE
High risk HPV: NEGATIVE
Neisseria Gonorrhea: NEGATIVE

## 2021-01-19 NOTE — ED Triage Notes (Signed)
Pt c/o right arm stiffness and hand swelling. Pt recently admitted for anemia and is concerned that it is related to the IV that was placed in same arm.

## 2021-01-20 ENCOUNTER — Ambulatory Visit (HOSPITAL_COMMUNITY)
Admission: RE | Admit: 2021-01-20 | Discharge: 2021-01-20 | Disposition: A | Discharge status: Home / Self Care | Payer: Self-pay | Source: Ambulatory Visit | Attending: Emergency Medicine | Admitting: Emergency Medicine

## 2021-01-20 DIAGNOSIS — M7989 Other specified soft tissue disorders: Secondary | ICD-10-CM | POA: Insufficient documentation

## 2021-01-20 DIAGNOSIS — M79621 Pain in right upper arm: Secondary | ICD-10-CM | POA: Insufficient documentation

## 2021-01-20 DIAGNOSIS — I808 Phlebitis and thrombophlebitis of other sites: Secondary | ICD-10-CM | POA: Insufficient documentation

## 2021-01-20 NOTE — ED Provider Notes (Signed)
Patient return for repeat results of upper extremity venous Doppler.  Doppler study is negative for DVT.  Segmental superficial thrombophlebitis of cephalic vein is noted Patient informed of results    Pattricia Boss, MD 01/20/21 1238

## 2021-01-20 NOTE — Discharge Instructions (Addendum)
Wear arm sling for comfort.  Return tomorrow at the given time for an ultrasound of your arm to rule out a blood clot.

## 2021-01-20 NOTE — ED Provider Notes (Signed)
[facility] EMERGENCY DEPARTMENT Provider Note   CSN: [id number] Arrival date & time: 01/19/21  2311     History Chief Complaint  Patient presents with  . Arm Pain    Glenda Ray is a 50 y.o. female.  Patient is a 50 year old female with history of uterine fibroids and recent admission for vaginal bleeding/anemia.  She received blood transfusions, then was discharged and seen by GYN.  She presents today for evaluation of right arm pain.  She describes pain from her shoulder to her elbow that is worse when she attempts to move her arm.  She denies any trauma or falls.  She denies any specific injury.  She is concerned because this is the arm where her IV had been.  She denies any fevers or chills.  She denies any redness to the arm.  She does feel as though her fingertips are somewhat swollen.  The history is provided by the patient.  Arm Pain This is a new problem. The current episode started yesterday. The problem occurs constantly. The problem has been rapidly worsening. Pertinent negatives include no chest pain and no shortness of breath. Exacerbated by: Movement, position, and palpation. Nothing relieves the symptoms. She has tried nothing for the symptoms.       Past Medical History:  Diagnosis Date  . Anemia   . Fibroids   . Pneumonia 2013    Patient Active Problem List   Diagnosis Date Noted  . Fibroids 01/17/2021  . Iron deficiency anemia due to chronic blood loss 01/17/2021  . Routine cervical smear 01/17/2021  . Menorrhagia with regular cycle   . Palpitations   . Symptomatic anemia 01/16/2021  . DUB (dysfunctional uterine bleeding) 01/16/2021    Past Surgical History:  Procedure Laterality Date  . LAPAROSCOPIC TUBAL LIGATION       OB History    Gravida  2   Para  2   Term  2   Preterm      AB      Living  2     SAB      IAB      Ectopic      Multiple      Live Births  2           Family History  Problem Relation Age of Onset  .  Dysfunctional uterine bleeding Mother   . Stroke Mother   . Cancer Paternal Grandfather        lung  . Diabetes Maternal Grandmother   . Heart attack Maternal Grandfather   . Other Father        "stomach erupted"  . Autism Brother   . Cerebral palsy Son   . Seizures Son   . Osteopenia Son   . Crohn's disease Son     Social History   Tobacco Use  . Smoking status: Never Smoker  . Smokeless tobacco: Never Used  Vaping Use  . Vaping Use: Never used  Substance Use Topics  . Alcohol use: No  . Drug use: No    Home Medications Prior to Admission medications   Medication Sig Start Date End Date Taking? Authorizing Provider  acetaminophen (TYLENOL) 325 MG tablet Take 2 tablets (650 mg total) by mouth every 6 (six) hours as needed for mild pain or headache (or Fever >/= 101). 01/17/21   Barton Dubois, MD  bismuth subsalicylate (PEPTO BISMOL) 262 MG chewable tablet Chew 262 mg by mouth as needed for indigestion.    [provider]  Famotidine (PEPCID PO) Take 1 tablet by mouth daily as needed (acid reflux).    [provider]  Iron Combinations (NIFEREX) TABS Take 150 mg by mouth daily. Patient not taking: Reported on 01/17/2021 01/17/21   Barton Dubois, MD  megestrol (MEGACE) 40 MG tablet Take 3 daily at same time 01/17/21   Derrek Monaco A, NP  polyethylene glycol (MIRALAX / GLYCOLAX) 17 g packet Take 17 g by mouth daily as needed for mild constipation. 01/17/21   Barton Dubois, MD  promethazine (PHENERGAN) 25 MG tablet Take 1 tablet (25 mg total) by mouth every 6 (six) hours as needed for nausea or vomiting. 01/17/21   Estill Dooms, NP    Allergies    Amoxicillin and Vicodin [hydrocodone-acetaminophen]  Review of Systems   Review of Systems  Respiratory: Negative for shortness of breath.   Cardiovascular: Negative for chest pain.  All other systems reviewed and are negative.   Physical Exam Updated Vital Signs BP 112/82   Pulse 87   Temp 98.3 F  (36.8 C) (Oral)   Resp 16   Ht 5\' 1"  (1.549 m)   Wt 74.4 kg   LMP 01/02/2021 (Approximate)   SpO2 99%   BMI 30.99 kg/m   Physical Exam Vitals and nursing note reviewed.  Constitutional:      General: She is not in acute distress.    Appearance: Normal appearance. She is not ill-appearing.  HENT:     Head: Normocephalic and atraumatic.  Pulmonary:     Effort: Pulmonary effort is normal.  Musculoskeletal:     Comments: The right arm appears grossly normal.  The IV site appears to be healing well.  There is no surrounding erythema, warmth, or swelling.  There is no drainage.  She has tenderness and pain with range of motion of the shoulder or elbow.  Ulnar and radial pulses are easily palpable and motor and sensation are intact throughout the entire hand.  Skin:    General: Skin is warm and dry.  Neurological:     Mental Status: She is alert.     ED Results / Procedures / Treatments   Labs (all labs ordered are listed, but only abnormal results are displayed) Labs Reviewed - No data to display  EKG None  Radiology No results found.  Procedures Procedures   Medications Ordered in ED Medications - No data to display  ED Course  I have reviewed the triage vital signs and the nursing notes.  Pertinent labs & imaging results that were available during my care of the patient were reviewed by me and considered in my medical decision making (see chart for details).    MDM Rules/Calculators/A&P  Patient presenting with right arm pain.  This is the same arm where she received an IV for a blood transfusion several days ago.  This does not appear to be a thrombophlebitis as there is no warmth, redness, or streaking.  The IV site appears to be clean and healing well.  Her pain is reproducible with movement of the shoulder or elbow and she is neurovascularly intact throughout the entire arm.  Other possibilities include DVT.  Patient will be discharged in an arm sling for  comfort.  I have made arrangements for her to return in the morning for an ultrasound to rule out a blood clot.  Final Clinical Impression(s) / ED Diagnoses Final diagnoses:  None    Rx / DC Orders ED Discharge Orders  None       Veryl Speak, MD 01/20/21 [phone]

## 2021-01-26 ENCOUNTER — Ambulatory Visit (INDEPENDENT_AMBULATORY_CARE_PROVIDER_SITE_OTHER): Payer: Self-pay | Admitting: Obstetrics & Gynecology

## 2021-01-26 ENCOUNTER — Encounter: Payer: Self-pay | Admitting: Obstetrics & Gynecology

## 2021-01-26 ENCOUNTER — Telehealth: Payer: Self-pay

## 2021-01-26 ENCOUNTER — Other Ambulatory Visit: Payer: Self-pay

## 2021-01-26 VITALS — BP 134/84 | HR 97 | Ht 61.0 in | Wt 166.0 lb

## 2021-01-26 DIAGNOSIS — N920 Excessive and frequent menstruation with regular cycle: Secondary | ICD-10-CM

## 2021-01-26 DIAGNOSIS — D500 Iron deficiency anemia secondary to blood loss (chronic): Secondary | ICD-10-CM

## 2021-01-26 DIAGNOSIS — D219 Benign neoplasm of connective and other soft tissue, unspecified: Secondary | ICD-10-CM

## 2021-01-26 MED ORDER — MEGESTROL ACETATE 40 MG PO TABS: ORAL_TABLET | ORAL | 3 refills | Status: DC

## 2021-01-26 NOTE — Progress Notes (Signed)
Follow up appointment for results  Chief Complaint  Patient presents with  . Discuss hysterectomy    Blood pressure 134/84, pulse 97, height 5\' 1"  (1.549 m), weight 166 lb (75.3 kg), last menstrual period 01/02/2021.  Well-developed well-nourished female no acute distress Abdominal exam is significant for enlarged mass in the midline extending laterally, the mass represents the enlarged fibroid uterus and rises to the level of her umbilicus Abdominal exam is otherwise benign    CLINICAL DATA:  Dysfunctional uterine bleeding.  EXAM: TRANSABDOMINAL AND TRANSVAGINAL ULTRASOUND OF PELVIS  TECHNIQUE: Both transabdominal and transvaginal ultrasound examinations of the pelvis were performed. Transabdominal technique was performed for global imaging of the pelvis including uterus, ovaries, adnexal regions, and pelvic cul-de-sac. It was necessary to proceed with endovaginal exam following the transabdominal exam to visualize the uterus and ovaries.  COMPARISON:  No prior.  FINDINGS: Uterus  Measurements: 14.4 x 9.9 x 14.5 cm = volume: 1090.7 mL. A 7.6 and a 6.1 cm fibroid noted.  Endometrium  Thickness: Not visualized.  No focal abnormality visualized.  Right ovary  Measurements: 4.0 x 2.0 x 3.0 cm = volume: 12.7 mL. Limited visualization. No focal abnormality identified.  Left ovary  Measurements: Not visualized.  Other findings  No abnormal free fluid. Exam limited due to bowel gas and large fibroids.  IMPRESSION: 1.  Large fibroids as above.  2. Limited exam due to bowel gas and large fibroids. Endometrium and left ovary not visualized. Limited visualization right ovary. No free fluid.   Electronically Signed   By: Marcello Moores  Register   On: 01/17/2021 10:02     Recent Results (from the past 2160 hour(s))  Troponin I (High Sensitivity)     Status: None   Collection Time: 01/16/21 12:28 PM  Result Value Ref Range   Troponin I (High  Sensitivity) <2 <18 ng/L    Comment: (NOTE) Elevated high sensitivity troponin I (hsTnI) values and significant  changes across serial measurements may suggest ACS but many other  chronic and acute conditions are known to elevate hsTnI results.  Refer to the "Links" section for chest pain algorithms and additional  guidance. Performed at [facility], [address], [place], [place] [address]   CBC     Status: Abnormal   Collection Time: 01/16/21 12:28 PM  Result Value Ref Range   WBC 7.2 4.0 - 10.5 K/uL   RBC 4.32 3.87 - 5.11 MIL/uL   Hemoglobin 6.5 (LL) 12.0 - 15.0 g/dL    Comment: Reticulocyte Hemoglobin testing may be clinically indicated, consider ordering this additional test [id number] THIS CRITICAL RESULT HAS VERIFIED AND BEEN CALLED TO WALL,Y RN BY TAYLOR JONES ON 03 01 2022 AT 6384, AND HAS BEEN READ BACK.     HCT 26.2 (L) 36.0 - 46.0 %   MCV 60.6 (L) 80.0 - 100.0 fL   MCH 15.0 (L) 26.0 - 34.0 pg   MCHC 24.8 (L) 30.0 - 36.0 g/dL   RDW 21.2 (H) 11.5 - 15.5 %   Platelets 398 150 - 400 K/uL   nRBC 0.0 0.0 - 0.2 %    Comment: Performed at [facility], [address], [place], [place] [address]  Basic metabolic panel     Status: Abnormal   Collection Time: 01/16/21 12:28 PM  Result Value Ref Range   Sodium 138 135 - 145 mmol/L   Potassium 3.5 3.5 - 5.1 mmol/L   Chloride 106 98 - 111 mmol/L   CO2 23 22 - 32 mmol/L  Glucose, Bld 105 (H) 70 - 99 mg/dL    Comment: Glucose reference range applies only to samples taken after fasting for at least 8 hours.   BUN 8 6 - 20 mg/dL   Creatinine, Ser 0.51 0.44 - 1.00 mg/dL   Calcium 8.5 (L) 8.9 - 10.3 mg/dL   GFR, Estimated >60 >60 mL/min    Comment: (NOTE) Calculated using the CKD-EPI Creatinine Equation (2021)    Anion gap 9 5 - 15    Comment: Performed at [facility], [address] [place], [place] [address]  Magnesium     Status: None   Collection Time: 01/16/21 12:28 PM  Result Value Ref Range   Magnesium 2.1 1.7 - 2.4  mg/dL    Comment: Performed at [facility], [address] [place], [place] [address]  ABO/Rh     Status: None   Collection Time: 01/16/21 12:28 PM  Result Value Ref Range   ABO/RH(D)      A POS Performed at [facility], [address] [place], [place] [address]   Hemoglobin A1c     Status: None   Collection Time: 01/16/21 12:28 PM  Result Value Ref Range   Hgb A1c MFr Bld 5.1 4.8 - 5.6 %    Comment: (NOTE) Pre diabetes:          5.7%-6.4%  Diabetes:              >6.4%  Glycemic control for   <7.0% adults with diabetes    Mean Plasma Glucose 99.67 mg/dL    Comment: Performed at [place] [address], [place], [place] [address]  Lipid panel     Status: None   Collection Time: 01/16/21 12:28 PM  Result Value Ref Range   Cholesterol 150 0 - 200 mg/dL   Triglycerides 46 <150 mg/dL   HDL 51 >40 mg/dL   Total CHOL/HDL Ratio 2.9 RATIO   VLDL 9 0 - 40 mg/dL   LDL Cholesterol 90 0 - 99 mg/dL    Comment:        Total Cholesterol/HDL:CHD Risk Coronary Heart Disease Risk Table                     Men   Women  1/2 Average Risk   3.4   3.3  Average Risk       5.0   4.4  2 X Average Risk   9.6   7.1  3 X Average Risk  23.4   11.0        Use the calculated Patient Ratio above and the CHD Risk Table to determine the patient's CHD Risk.        ATP III CLASSIFICATION (LDL):  <100     mg/dL   Optimal  100-129  mg/dL   Near or Above                    Optimal  130-159  mg/dL   Borderline  160-189  mg/dL   High  >190     mg/dL   Very High Performed at [facility], [address], [place], [place] [address]   TSH     Status: None   Collection Time: 01/16/21 12:29 PM  Result Value Ref Range   TSH 3.963 0.350 - 4.500 uIU/mL    Comment: Performed by a 3rd Generation assay with a functional sensitivity of <=0.01 uIU/mL. Performed at [facility], [address] [place], [place]  [address]   POC occult blood, ED     Status: None   Collection Time: 01/16/21  2:19 PM   Result Value Ref Range   Fecal Occult Bld NEGATIVE NEGATIVE  Type and screen [facility]     Status: None   Collection Time: 01/16/21  3:00 PM  Result Value Ref Range   ABO/RH(D) A POS    Antibody Screen NEG    Sample Expiration 01/19/2021,2359    Unit Number [id number]    Blood Component Type RED CELLS,LR    Unit division 00    Status of Unit ISSUED,FINAL    Transfusion Status OK TO TRANSFUSE    Crossmatch Result Compatible    Unit Number [id number]    Blood Component Type RED CELLS,LR    Unit division 00    Status of Unit ISSUED,FINAL    Transfusion Status OK TO TRANSFUSE    Crossmatch Result      Compatible Performed at [facility], [address] [place], [place] [address]   Prepare RBC (crossmatch)     Status: None   Collection Time: 01/16/21  3:00 PM  Result Value Ref Range   Order Confirmation      ORDER PROCESSED BY BLOOD BANK Performed at [facility], [address], [place], [place] [address]   BPAM RBC     Status: None   Collection Time: 01/16/21  3:00 PM  Result Value Ref Range   ISSUE DATE / TIME 741287867672    Blood Product Unit Number [id number]    PRODUCT CODE [id number]    Unit Type and Rh 6200    Blood Product Expiration Date 035465681275    ISSUE DATE / TIME 170017494496    Blood Product Unit Number [id number]    PRODUCT CODE [id number]    Unit Type and Rh 6200    Blood Product Expiration Date 390300923300   SARS CORONAVIRUS 2 (TAT 6-24 HRS) Nasopharyngeal Nasopharyngeal Swab     Status: None   Collection Time: 01/16/21  3:05 PM   Specimen: Nasopharyngeal Swab  Result Value Ref Range   SARS Coronavirus 2 NEGATIVE NEGATIVE    Comment: (NOTE) SARS-CoV-2 target nucleic acids are NOT DETECTED.  The SARS-CoV-2 RNA is generally detectable in upper and lower respiratory specimens during the acute phase of infection. Negative results do not preclude SARS-CoV-2 infection, do not rule out co-infections with other pathogens, and should  not be used as the sole basis for treatment or other patient management decisions. Negative results must be combined with clinical observations, patient history, and epidemiological information. The expected result is Negative.  Fact Sheet for Patients: SugarRoll.be  Fact Sheet for Healthcare Providers: https://www.woods-mathews.com/  This test is not yet approved or cleared by the [place] FDA and  has been authorized for detection and/or diagnosis of SARS-CoV-2 by FDA under an Emergency Use Authorization (EUA). This EUA will remain  in effect (meaning this test can be used) for the duration of the COVID-19 declaration under Se ction 564(b)(1) of the Act, 21 U.S.C. section 360bbb-3(b)(1), unless the authorization is terminated or revoked sooner.  Performed at [facility], [place] [address], [place], [place] [address]   Basic metabolic panel     Status: Abnormal   Collection Time: 01/17/21  5:05 AM  Result Value Ref Range   Sodium 137 135 - 145 mmol/L   Potassium 4.5 3.5 - 5.1 mmol/L    Comment: DELTA CHECK NOTED   Chloride 106 98 - 111 mmol/L  CO2 20 (L) 22 - 32 mmol/L   Glucose, Bld 84 70 - 99 mg/dL    Comment: Glucose reference range applies only to samples taken after fasting for at least 8 hours.   BUN 12 6 - 20 mg/dL   Creatinine, Ser 0.52 0.44 - 1.00 mg/dL   Calcium 8.6 (L) 8.9 - 10.3 mg/dL   GFR, Estimated >60 >60 mL/min    Comment: (NOTE) Calculated using the CKD-EPI Creatinine Equation (2021)    Anion gap 11 5 - 15    Comment: Performed at [facility], [address] [place], [place] [address]  CBC     Status: Abnormal   Collection Time: 01/17/21  5:05 AM  Result Value Ref Range   WBC 6.8 4.0 - 10.5 K/uL   RBC 5.18 (H) 3.87 - 5.11 MIL/uL   Hemoglobin 9.3 (L) 12.0 - 15.0 g/dL    Comment: POST TRANSFUSION SPECIMEN   HCT 34.6 (L) 36.0 - 46.0 %   MCV 66.8 (L) 80.0 - 100.0 fL    Comment: POST TRANSFUSION  SPECIMEN   MCH 18.0 (L) 26.0 - 34.0 pg   MCHC 26.9 (L) 30.0 - 36.0 g/dL   RDW 26.4 (H) 11.5 - 15.5 %   Platelets 216 150 - 400 K/uL   nRBC 0.0 0.0 - 0.2 %    Comment: Performed at [facility], [address] [place], [place] [address]  HIV Antibody (routine testing w rflx)     Status: None   Collection Time: 01/17/21  5:05 AM  Result Value Ref Range   HIV Screen 4th Generation wRfx Non Reactive Non Reactive    Comment: Performed at [facility], [place] [address] [place], [place] [address]  Cytology - PAP( [place])     Status: None   Collection Time: 01/17/21 11:45 AM  Result Value Ref Range   High risk HPV Negative    Neisseria Gonorrhea Negative    Chlamydia Negative    Adequacy      Satisfactory for evaluation; transformation zone component ABSENT.   Diagnosis      - Negative for intraepithelial lesion or malignancy (NILM)   Comment Normal Reference Range HPV - Negative    Comment Normal Reference Ranger Chlamydia - Negative    Comment      Normal Reference Range Neisseria Gonorrhea - Negative    MEDS ordered this encounter: Meds ordered this encounter  Medications  . megestrol (MEGACE) 40 MG tablet    Sig: 3 tablets a day for 5 days, 2 tablets a day for 5 days then 1 tablet daily    Dispense:  45 tablet    Refill:  3    Orders for this encounter: No orders of the defined types were placed in this encounter.  Sonogram images, laboratory results including recent Pap smear have all been evaluated  Impression:   ICD-10-CM   1. Fibroids, 20 weeks size, 1070 grams by sonogram  D21.9   2. Menorrhagia with regular cycle  N92.0    Continue megestrol therapy  3. Iron deficiency anemia due to chronic blood loss  D50.0    Has received transfusion      Plan: Options for management were discussed in full.  Patient is aware she has a dramatically enlarged fibroid uterus on both exam and confirmed by sonogram She has had menorrhagia over the years culminating in  the need for a blood transfusion through the emergency department At this point the patient and I discussed  served of management with megestrol therapy through menopause versus hysterectomy We discussed different hysterectomy options including abdominal supracervical, total abdominal and with and without removal of ovaries She is going to consider options at this point She understands the risk of bleeding which will require another transfusion, postoperative infection including the wound and peritoneum, and injury to other organs such as the bladder ureters and bowel Jocelyn Lamer will consider these options and will communicate with [place] regarding that decision At this point it appears she is leaning toward abdominal supracervical with removal of both tubes and ovaries given her age  All questions were answered   Follow Up: Per patient decision      All questions were answered.  Past Medical History:  Diagnosis Date  . Anemia   . Fibroids   . Pneumonia 2013    Past Surgical History:  Procedure Laterality Date  . LAPAROSCOPIC TUBAL LIGATION      OB History    Gravida  2   Para  2   Term  2   Preterm      AB      Living  2     SAB      IAB      Ectopic      Multiple      Live Births  2           Allergies  Allergen Reactions  . Amoxicillin   . Vicodin [Hydrocodone-Acetaminophen]     Social History   Socioeconomic History  . Marital status: Married    Spouse name: Not on file  . Number of children: Not on file  . Years of education: Not on file  . Highest education level: Not on file  Occupational History  . 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 Smoking status: Never Smoker  . Smokeless tobacco: Never Used  Vaping Use  . Vaping Use: Never used  Substance and Sexual Activity  . Alcohol use: No  . Drug use: No  . Sexual activity: Yes    Birth control/protection: Surgical    Comment: tubal  Other Topics Concern  . Not on  file  Social History Narrative  . Not on file   Social Determinants of Health   Financial Resource Strain: Low Risk   . Difficulty of Paying Living Expenses: Not hard at all  Food Insecurity: No Food Insecurity  . Worried About Charity fundraiser in the Last Year: Never true  . Ran Out of Food in the Last Year: Never true  Transportation Needs: No Transportation Needs  . Lack of Transportation (Medical): No  . Lack of Transportation (Non-Medical): No  Physical Activity: Inactive  . Days of Exercise per Week: 0 days  . Minutes of Exercise per Session: 0 min  Stress: No Stress Concern Present  . Feeling of Stress : Only a little  Social Connections: Moderately Isolated  . Frequency of Communication with Friends and Family: More than three times a week  . Frequency of Social Gatherings with Friends and Family: Never  . Attends Religious Services: Never  . Active Member of Clubs or Organizations: No  . Attends Archivist Meetings: Never  . Marital Status: Married    Family History  Problem Relation Age of Onset  . Dysfunctional uterine bleeding Mother   . Stroke Mother   . Cancer Paternal Grandfather        lung  . Diabetes Maternal Grandmother   . Heart attack Maternal  Grandfather   . Other Father        "stomach erupted"  . Autism Brother   . Cerebral palsy Son   . Seizures Son   . Osteopenia Son   . Crohn's disease Son

## 2021-01-26 NOTE — Telephone Encounter (Signed)
Called to follow-up with client. Last conversation and discussion about [facility] and services and documents needed, she wished to think about it.  Today called, client was not home at this time, left message asking return call please with son that answered the phone. [place] number [phone]  Mayra Reel RN [facility]

## 2021-01-30 ENCOUNTER — Other Ambulatory Visit: Payer: Self-pay | Admitting: Adult Health

## 2021-01-30 ENCOUNTER — Telehealth: Payer: Self-pay | Admitting: Adult Health

## 2021-01-30 MED ORDER — FERROUS SULFATE 325 (65 FE) MG PO TBEC: 325.0000 mg | DELAYED_RELEASE_TABLET | Freq: Two times a day (BID) | ORAL | 3 refills | Status: DC

## 2021-01-30 NOTE — Progress Notes (Signed)
Will rx iron 325 mg

## 2021-01-30 NOTE — Telephone Encounter (Signed)
Pt advised through [place]. Encounter closed. [place]

## 2021-01-30 NOTE — Telephone Encounter (Signed)
Pt not feeling well, tired, mild palpitations, on meds to prevent her cycle, is time for her cycle, wonders if it's related to her body expecting to have a her cycle   Also recently had blood transfusion due to heavy cycles & low hemoglobin   Currently taking 65mg  Iron OTC twice daily - unable to afford the iron supplement that the hospital ordered   Please advise     [facility]

## 2021-02-02 ENCOUNTER — Telehealth: Payer: Self-pay | Admitting: Adult Health

## 2021-02-17 ENCOUNTER — Other Ambulatory Visit: Payer: Self-pay | Admitting: Obstetrics and Gynecology

## 2021-02-17 MED ORDER — TRANEXAMIC ACID 650 MG PO TABS: 1300.0000 mg | ORAL_TABLET | Freq: Three times a day (TID) | ORAL | 0 refills | Status: DC

## 2021-02-17 NOTE — Progress Notes (Signed)
GYN telephone note rn called for period like bleeding lysteda instead of megace to carry her to surgery  Durene Romans MD [facility]ulty Practice) 02/17/2021 Time: 1018am

## 2021-02-19 ENCOUNTER — Encounter (HOSPITAL_COMMUNITY): Payer: Self-pay

## 2021-02-19 ENCOUNTER — Other Ambulatory Visit (HOSPITAL_COMMUNITY)
Admission: RE | Admit: 2021-02-19 | Discharge: 2021-02-19 | Disposition: A | Discharge status: Home / Self Care | Payer: Self-pay | Source: Ambulatory Visit | Attending: Obstetrics & Gynecology | Admitting: Obstetrics & Gynecology

## 2021-02-19 ENCOUNTER — Other Ambulatory Visit: Payer: Self-pay | Admitting: Obstetrics & Gynecology

## 2021-02-19 ENCOUNTER — Other Ambulatory Visit: Payer: Self-pay

## 2021-02-19 ENCOUNTER — Encounter (HOSPITAL_COMMUNITY)
Admission: RE | Admit: 2021-02-19 | Discharge: 2021-02-19 | Disposition: A | Discharge status: Home / Self Care | Payer: Self-pay | Source: Ambulatory Visit | Attending: Obstetrics & Gynecology | Admitting: Obstetrics & Gynecology

## 2021-02-19 DIAGNOSIS — Z20822 Contact with and (suspected) exposure to covid-19: Secondary | ICD-10-CM | POA: Insufficient documentation

## 2021-02-19 DIAGNOSIS — Z01812 Encounter for preprocedural laboratory examination: Secondary | ICD-10-CM | POA: Insufficient documentation

## 2021-02-19 LAB — URINALYSIS, ROUTINE W REFLEX MICROSCOPIC
Bilirubin Urine: NEGATIVE
Glucose, UA: NEGATIVE mg/dL
Ketones, ur: NEGATIVE mg/dL
Nitrite: NEGATIVE
Protein, ur: NEGATIVE mg/dL
RBC / HPF: >50 RBC/hpf — ABNORMAL HIGH (ref 0–5)
Specific Gravity, Urine: 1.006 (ref 1.005–1.030)
pH: 7 (ref 5.0–8.0)

## 2021-02-19 LAB — COMPREHENSIVE METABOLIC PANEL
ALT: 27 U/L (ref 0–44)
AST: 23 U/L (ref 15–41)
Albumin: 4.8 g/dL (ref 3.5–5.0)
Alkaline Phosphatase: 56 U/L (ref 38–126)
Anion gap: 13 (ref 5–15)
BUN: 9 mg/dL (ref 6–20)
CO2: 19 mmol/L — ABNORMAL LOW (ref 22–32)
Calcium: 9.6 mg/dL (ref 8.9–10.3)
Chloride: 107 mmol/L (ref 98–111)
Creatinine, Ser: 0.62 mg/dL (ref 0.44–1.00)
GFR, Estimated: >60 mL/min (ref >60)
Glucose, Bld: 85 mg/dL (ref 70–99)
Potassium: 3.9 mmol/L (ref 3.5–5.1)
Sodium: 139 mmol/L (ref 135–145)
Total Bilirubin: 0.4 mg/dL (ref 0.3–1.2)
Total Protein: 8.6 g/dL — ABNORMAL HIGH (ref 6.5–8.1)

## 2021-02-19 LAB — CBC
HCT: 47.6 % — ABNORMAL HIGH (ref 36.0–46.0)
Hemoglobin: 14.3 g/dL (ref 12.0–15.0)
MCH: 23.3 pg — ABNORMAL LOW (ref 26.0–34.0)
MCHC: 30 g/dL (ref 30.0–36.0)
MCV: 77.4 fL — ABNORMAL LOW (ref 80.0–100.0)
Platelets: 259 10*3/uL (ref 150–400)
RBC: 6.15 MIL/uL — ABNORMAL HIGH (ref 3.87–5.11)
WBC: 10.7 10*3/uL — ABNORMAL HIGH (ref 4.0–10.5)
nRBC: 0 % (ref 0.0–0.2)

## 2021-02-19 LAB — HCG, QUANTITATIVE, PREGNANCY: hCG, Beta Chain, Quant, S: <1 m[IU]/mL (ref <5)

## 2021-02-19 LAB — RAPID HIV SCREEN (HIV 1/2 AB+AG)
HIV 1/2 Antibodies: NONREACTIVE
HIV-1 P24 Antigen - HIV24: NONREACTIVE

## 2021-02-19 NOTE — Patient Instructions (Signed)
Glenda Ray  02/19/2021     @PREFPERIOPPHARMACY @   Your procedure is scheduled on 02/21/2021.  Report to [facility] at 6:15 A.M.  Call this number if you have problems the morning of surgery:  [phone]   Remember:  Do not eat or drink after midnight.    Please drink your ERAS drink by 3:30 the am of surgery.  Nothing else to drink after that.     Take these medicines the morning of surgery with A SIP OF WATER:  Pepcid and Phenergan if needed    Do not wear jewelry, make-up or nail polish.  Do not wear lotions, powders, or perfumes, or deodorant.  Do not shave 48 hours prior to surgery.  Men may shave face and neck.  Do not bring valuables to the hospital.  [facility] is not responsible for any belongings or valuables.  Contacts, dentures or bridgework may not be worn into surgery.  Leave your suitcase in the car.  After surgery it may be brought to your room.  For patients admitted to the hospital, discharge time will be determined by your treatment team.  Patients discharged the day of surgery will not be allowed to drive home.   Name and phone number of your driver:   family Special instructions:   Please use the Hibiclens Body Wash the night before surgery and the am of surgery.  Do not use on your face hair and private areas.  Please wash well then rinse and pat dry.   Please read over the following fact sheets that you were given. Care and Recovery After Surgery    General Anesthesia, Adult General anesthesia is the use of medicines to make a person "go to sleep" (unconscious) for a medical procedure. General anesthesia must be used for certain procedures, and is often recommended for procedures that:  Last a long time.  Require you to be still or in an unusual position.  Are major and can cause blood loss. The medicines used for general anesthesia are called general anesthetics. As well as making you unconscious for a certain amount of time, these  medicines:  Prevent pain.  Control your blood pressure.  Relax your muscles. Tell a health care provider about:  Any allergies you have.  All medicines you are taking, including vitamins, herbs, eye drops, creams, and over-the-counter medicines.  Any problems you or family members have had with anesthetic medicines.  Types of anesthetics you have had in the past.  Any blood disorders you have.  Any surgeries you have had.  Any medical conditions you have.  Any recent upper respiratory, chest, or ear infections.  Any history of: ? Heart or lung conditions, such as heart failure, sleep apnea, asthma, or chronic obstructive pulmonary disease (COPD). ? [employer] forces logistics/support/administrative officer. ? Depression or anxiety.  Any tobacco or drug use, including marijuana or alcohol use.  Whether you are pregnant or may be pregnant. What are the risks? Generally, this is a safe procedure. However, problems may occur, including:  Allergic reaction.  Lung and heart problems.  Inhaling food or liquid from the stomach into the lungs (aspiration).  Nerve injury.  Dental injury.  Air in the bloodstream, which can lead to stroke.  Extreme agitation or confusion (delirium) when you wake up from the anesthetic.  Waking up during your procedure and being unable to move. This is rare. These problems are more likely to develop if you are having a major surgery or if you have  an advanced or serious medical condition. You can prevent some of these complications by answering all of your health care provider's questions thoroughly and by following all instructions before your procedure. General anesthesia can cause side effects, including:  Nausea or vomiting.  A sore throat from the breathing tube.  Hoarseness.  Wheezing or coughing.  Shaking chills.  Tiredness.  Body aches.  Anxiety.  Sleepiness or drowsiness.  Confusion or agitation. What happens before the procedure? Staying hydrated Follow  instructions from your health care provider about hydration, which may include:  Up to 2 hours before the procedure - you may continue to drink clear liquids, such as water, clear fruit juice, black coffee, and plain tea.   Eating and drinking restrictions Follow instructions from your health care provider about eating and drinking, which may include:  8 hours before the procedure - stop eating heavy meals or foods such as meat, fried foods, or fatty foods.  6 hours before the procedure - stop eating light meals or foods, such as toast or cereal.  6 hours before the procedure - stop drinking milk or drinks that contain milk.  2 hours before the procedure - stop drinking clear liquids. Medicines Ask your health care provider about:  Changing or stopping your regular medicines. This is especially important if you are taking diabetes medicines or blood thinners.  Taking medicines such as aspirin and ibuprofen. These medicines can thin your blood. Do not take these medicines unless your health care provider tells you to take them.  Taking over-the-counter medicines, vitamins, herbs, and supplements. Do not take these during the week before your procedure unless your health care provider approves them. General instructions  Starting 3-6 weeks before the procedure, do not use any products that contain nicotine or tobacco, such as cigarettes and e-cigarettes. If you need help quitting, ask your health care provider.  If you brush your teeth on the morning of the procedure, make sure to spit out all of the toothpaste.  Tell your health care provider if you become ill or develop a cold, cough, or fever.  If instructed by your health care provider, bring your sleep apnea device with you on the day of your surgery (if applicable).  Ask your health care provider if you will be going home the same day, the following day, or after a longer hospital stay. ? Plan to have a responsible adult take you  home from the hospital or clinic. ? Plan to have a responsible adult care for you for the time you are told after you leave the hospital or clinic. This is important. What happens during the procedure?  You will be given anesthetics through both of the following: ? A mask placed over your nose and mouth. ? An IV in one of your veins.  You may receive a medicine to help you relax (sedative).  After you are unconscious, a breathing tube may be inserted down your throat to help you breathe. This will be removed before you wake up.  An anesthesia specialist will stay with you throughout your procedure. He or she will: ? Keep you comfortable and safe by continuing to give you medicines and adjusting the amount of medicine that you get. ? Monitor your blood pressure, pulse, and oxygen levels to make sure that the anesthetics do not cause any problems. The procedure may vary among health care providers and hospitals.   What happens after the procedure?  Your blood pressure, temperature, heart rate, breathing rate,  and blood oxygen level will be monitored until the medicines you were given have worn off.  You will wake up in a recovery area. You may wake up slowly.  If you feel anxious or agitated, you may be given medicine to help you calm down.  If you will be going home the same day, your health care provider may check to make sure you can walk, drink, and urinate.  Your health care provider will treat any pain or side effects you have before you go home.  Do not drive or operate machinery until your health care provider says that it is safe. Summary  General anesthesia is used to keep you still and prevent pain during a procedure.  It is important to tell your health care provider about your medical history and any surgeries you have had, and previous experience with anesthesia.  Follow your health care provider's instructions about when to stop eating, drinking, or taking certain  medicines before your procedure.  Plan to have a responsible adult take you home from the hospital or clinic. This information is not intended to replace advice given to you by your health care provider. Make sure you discuss any questions you have with your health care provider. Document Revised: 07/17/2020 Document Reviewed: 02/16/2020 Elsevier Patient Education  2021 [employer].  Hysterectomy Information  A hysterectomy is a surgery in which the uterus is removed. The lowest part of the uterus (cervix), which opens into the vagina, may be removed as well. In some cases, the fallopian tubes, the ovaries,  or both the fallopian tubes and the ovaries may also be removed. This procedure may be done to treat different medical problems. It may also be done to help transgender men feel more masculine. After the procedure, a woman will no longer have menstrual periods and will not be able to become pregnant (sterile). What are the reasons for a hysterectomy? There are many reasons why a person might have this procedure. They include:  Persistent, abnormal vaginal bleeding.  Long-term (chronic) pelvic pain or infection.  Endometriosis. This is when the lining of the uterus (endometrium) starts to grow outside the uterus.  Adenomyosis. This is when the endometrium starts to grow in the muscle of the uterus.  Pelvic organ prolapse. This is a condition in which the uterus falls down into the vagina.  Noncancerous growths in the uterus (uterine fibroids) that cause symptoms.  The presence of precancerous cells.  Cervical or uterine cancer.  Sex change. This helps a transgender man complete his female identity. What are the different types of hysterectomy? There are three different types of hysterectomy:  Supracervical hysterectomy. In this type, the top part of the uterus is removed, but not the cervix.  Total hysterectomy. In this type, the uterus and cervix are removed.  Radical  hysterectomy. In this type, the uterus, the cervix, and the tissue that holds the uterus in place (parametrium) are removed. What are the different ways a hysterectomy can be performed? There are many different ways a hysterectomy can be performed, including:  Abdominal hysterectomy. In this type, an incision is made in the abdomen. The uterus is removed through this incision.  Vaginal hysterectomy. In this type, an incision is made in the vagina. The uterus is removed through this incision. There are no abdominal incisions.  Conventional laparoscopic hysterectomy. In this type, 3 or 4 small incisions are made in the abdomen. A thin, lighted tube with a camera (laparoscope) is inserted into one of  the incisions. Other tools are put through the other incisions. The uterus is cut into small pieces. The small pieces are removed through the incisions or the vagina.  Laparoscopically assisted vaginal hysterectomy (LAVH). In this type, 3 or 4 small incisions are made in the abdomen. Part of the surgery is performed laparoscopically and the other part is done vaginally. The uterus is removed through the vagina.  Robot-assisted laparoscopic hysterectomy. In this type, a laparoscope and other tools are inserted into 3 or 4 small incisions in the abdomen. A computer-controlled device is used to give the surgeon a 3D image and to help control the surgical instruments. This allows for more precise movements of surgical instruments. The uterus is cut into small pieces and removed through the incisions or the vagina. Discuss the options with your health care provider to determine which type is the right one for you. What are the risks of this surgery? Generally, this is a safe procedure. However, problems may occur, including:  Bleeding and risk of blood transfusion. Tell your health care provider if you do not want to receive any blood products.  Blood clots in the legs or lung.  Infection.  Damage to  nearby structures or organs.  Allergic reactions to medicines.  Having to change to an abdominal hysterectomy after starting a less invasive technique. What to expect after a hysterectomy  You will be given pain medicine.  You may need to stay in the hospital for 1-2 days to recover, depending on the type of hysterectomy you had.  You will need to have someone with you for the first 3-5 days after you go home.  You will need to follow up with your surgeon in 2-4 weeks after surgery to evaluate your progress.  If the ovaries are removed, you will have early menopause symptoms such as hot flashes, night sweats, and insomnia. If you had a hysterectomy for a problem that was not cancer or a condition that could not lead to cancer, then you no longer need Pap tests. However, even if you no longer need a Pap test, get regular pelvic exams to make sure no other problems are developing. Questions to ask your health care provider  Is a hysterectomy medically necessary? Do I have other treatment options for my condition?  What are my options for hysterectomy procedure?  What organs and tissues need to be removed?  What are the risks?  What are the benefits?  How long will I need to stay in the hospital after the procedure?  How long will I need to recover at home?  What symptoms can I expect after the procedure? Summary  A hysterectomy is a surgery in which the uterus is removed. The fallopian tubes, the ovaries, or both may be removed as well.  This procedure may be done to treat different medical problems. It may also be done to complete your female identity during a sex change.  After the procedure, a woman will no longer have a menstrual period and will not be able to become pregnant.  There are three types of hysterectomy. Discuss with your health care provider which options are right for you.  This is a safe procedure, though there are some risks. Risks include infection,  bleeding, blood clots, or damage to nearby organs. This information is not intended to replace advice given to you by your health care provider. Make sure you discuss any questions you have with your health care provider. Document Revised: 09/03/2019 Document Reviewed:  09/03/2019 Elsevier Patient Education  2021 [employer].

## 2021-02-20 LAB — SARS CORONAVIRUS 2 (TAT 6-24 HRS): SARS Coronavirus 2: NEGATIVE

## 2021-02-20 MED ORDER — GENTAMICIN SULFATE 40 MG/ML IJ SOLN
5.0000 mg/kg | INTRAVENOUS | Status: AC
  Administered 2021-02-21: 376.4 mg via INTRAVENOUS
  Filled 2021-02-20: qty 9.5

## 2021-02-20 MED ORDER — CLINDAMYCIN PHOSPHATE 900 MG/50ML IV SOLN
900.0000 mg | INTRAVENOUS | Status: AC
  Administered 2021-02-21: 900 mg via INTRAVENOUS
  Filled 2021-02-20: qty 50

## 2021-02-21 ENCOUNTER — Ambulatory Visit (HOSPITAL_COMMUNITY): Payer: Self-pay | Admitting: Certified Registered Nurse Anesthetist

## 2021-02-21 ENCOUNTER — Encounter (HOSPITAL_COMMUNITY): Payer: Self-pay | Admitting: Obstetrics & Gynecology

## 2021-02-21 ENCOUNTER — Ambulatory Visit (HOSPITAL_COMMUNITY)
Admission: RE | Admit: 2021-02-21 | Discharge: 2021-02-21 | Disposition: A | Discharge status: Home / Self Care | Payer: Self-pay | Attending: Obstetrics & Gynecology | Admitting: Obstetrics & Gynecology

## 2021-02-21 ENCOUNTER — Encounter (HOSPITAL_COMMUNITY)
Admission: RE | Disposition: A | Discharge status: Home / Self Care | Payer: Self-pay | Source: Home / Self Care | Attending: Obstetrics & Gynecology

## 2021-02-21 DIAGNOSIS — N800 Endometriosis of uterus: Secondary | ICD-10-CM | POA: Insufficient documentation

## 2021-02-21 DIAGNOSIS — N8302 Follicular cyst of left ovary: Secondary | ICD-10-CM | POA: Insufficient documentation

## 2021-02-21 DIAGNOSIS — N8301 Follicular cyst of right ovary: Secondary | ICD-10-CM | POA: Insufficient documentation

## 2021-02-21 DIAGNOSIS — N920 Excessive and frequent menstruation with regular cycle: Secondary | ICD-10-CM | POA: Insufficient documentation

## 2021-02-21 DIAGNOSIS — Z880 Allergy status to penicillin: Secondary | ICD-10-CM | POA: Insufficient documentation

## 2021-02-21 DIAGNOSIS — N926 Irregular menstruation, unspecified: Secondary | ICD-10-CM

## 2021-02-21 DIAGNOSIS — D259 Leiomyoma of uterus, unspecified: Secondary | ICD-10-CM | POA: Insufficient documentation

## 2021-02-21 HISTORY — PX: SUPRACERVICAL ABDOMINAL HYSTERECTOMY: SHX5393

## 2021-02-21 HISTORY — PX: BILATERAL SALPINGECTOMY: SHX5743

## 2021-02-21 SURGERY — HYSTERECTOMY, SUPRACERVICAL, ABDOMINAL
Anesthesia: General | Site: Abdomen

## 2021-02-21 MED ORDER — FENTANYL CITRATE (PF) 250 MCG/5ML IJ SOLN
INTRAMUSCULAR | Status: AC
  Filled 2021-02-21: qty 5

## 2021-02-21 MED ORDER — OXYCODONE-ACETAMINOPHEN 7.5-325 MG PO TABS
1.0000 | ORAL_TABLET | ORAL | Status: DC | PRN
  Administered 2021-02-21: 1 via ORAL
  Filled 2021-02-21: qty 1

## 2021-02-21 MED ORDER — KETOROLAC TROMETHAMINE 30 MG/ML IJ SOLN
30.0000 mg | Freq: Once | INTRAMUSCULAR | Status: AC
  Administered 2021-02-21: 30 mg via INTRAMUSCULAR
  Filled 2021-02-21: qty 1

## 2021-02-21 MED ORDER — FENTANYL CITRATE (PF) 100 MCG/2ML IJ SOLN
25.0000 ug | INTRAMUSCULAR | Status: DC | PRN
  Administered 2021-02-21 (×3): 50 ug via INTRAVENOUS
  Filled 2021-02-21 (×2): qty 2

## 2021-02-21 MED ORDER — METOCLOPRAMIDE HCL 5 MG/ML IJ SOLN
10.0000 mg | Freq: Once | INTRAMUSCULAR | Status: AC
  Administered 2021-02-21: 10 mg via INTRAVENOUS
  Filled 2021-02-21: qty 2

## 2021-02-21 MED ORDER — SCOPOLAMINE 1 MG/3DAYS TD PT72
MEDICATED_PATCH | TRANSDERMAL | Status: AC
  Filled 2021-02-21: qty 1

## 2021-02-21 MED ORDER — LACTATED RINGERS IV SOLN: INTRAVENOUS | Status: DC

## 2021-02-21 MED ORDER — CHLORHEXIDINE GLUCONATE 0.12 % MT SOLN
OROMUCOSAL | Status: AC
  Filled 2021-02-21: qty 15

## 2021-02-21 MED ORDER — SUCCINYLCHOLINE CHLORIDE 200 MG/10ML IV SOSY
PREFILLED_SYRINGE | INTRAVENOUS | Status: DC | PRN
  Administered 2021-02-21: 100 mg via INTRAVENOUS

## 2021-02-21 MED ORDER — ROCURONIUM BROMIDE 100 MG/10ML IV SOLN
INTRAVENOUS | Status: DC | PRN
  Administered 2021-02-21: 50 mg via INTRAVENOUS

## 2021-02-21 MED ORDER — OXYCODONE-ACETAMINOPHEN 7.5-325 MG PO TABS: 1.0000 | ORAL_TABLET | ORAL | 0 refills | Status: DC | PRN

## 2021-02-21 MED ORDER — POVIDONE-IODINE 10 % EX SWAB: 2.0000 "application " | Freq: Once | CUTANEOUS | Status: DC

## 2021-02-21 MED ORDER — FENTANYL CITRATE (PF) 100 MCG/2ML IJ SOLN
INTRAMUSCULAR | Status: DC | PRN
  Administered 2021-02-21: 100 ug via INTRAVENOUS
  Administered 2021-02-21 (×3): 50 ug via INTRAVENOUS

## 2021-02-21 MED ORDER — ONDANSETRON 8 MG PO TBDP: 8.0000 mg | ORAL_TABLET | Freq: Three times a day (TID) | ORAL | 0 refills | Status: DC | PRN

## 2021-02-21 MED ORDER — DEXAMETHASONE SODIUM PHOSPHATE 10 MG/ML IJ SOLN
INTRAMUSCULAR | Status: DC | PRN
  Administered 2021-02-21: 10 mg via INTRAVENOUS

## 2021-02-21 MED ORDER — SUGAMMADEX SODIUM 200 MG/2ML IV SOLN
INTRAVENOUS | Status: DC | PRN
  Administered 2021-02-21: 200 mg via INTRAVENOUS

## 2021-02-21 MED ORDER — MIDAZOLAM HCL 2 MG/2ML IJ SOLN
INTRAMUSCULAR | Status: AC
  Filled 2021-02-21: qty 2

## 2021-02-21 MED ORDER — ONDANSETRON HCL 4 MG/2ML IJ SOLN: 4.0000 mg | Freq: Once | INTRAMUSCULAR | Status: DC | PRN

## 2021-02-21 MED ORDER — CHLORHEXIDINE GLUCONATE 0.12 % MT SOLN
15.0000 mL | Freq: Once | OROMUCOSAL | Status: AC
  Administered 2021-02-21: 15 mL via OROMUCOSAL

## 2021-02-21 MED ORDER — SODIUM CHLORIDE 0.9 % IR SOLN
Status: DC | PRN
  Administered 2021-02-21: 2000 mL

## 2021-02-21 MED ORDER — ORAL CARE MOUTH RINSE: 15.0000 mL | Freq: Once | OROMUCOSAL | Status: AC

## 2021-02-21 MED ORDER — HEMOSTATIC AGENTS (NO CHARGE) OPTIME
TOPICAL | Status: DC | PRN
  Administered 2021-02-21: 1 via TOPICAL

## 2021-02-21 MED ORDER — KETOROLAC TROMETHAMINE 10 MG PO TABS: 10.0000 mg | ORAL_TABLET | Freq: Three times a day (TID) | ORAL | 0 refills | Status: DC | PRN

## 2021-02-21 MED ORDER — PROPOFOL 10 MG/ML IV BOLUS
INTRAVENOUS | Status: DC | PRN
  Administered 2021-02-21: 160 mg via INTRAVENOUS

## 2021-02-21 MED ORDER — ACETAMINOPHEN 10 MG/ML IV SOLN
1000.0000 mg | Freq: Once | INTRAVENOUS | Status: AC
  Administered 2021-02-21: 1000 mg via INTRAVENOUS
  Filled 2021-02-21: qty 100

## 2021-02-21 MED ORDER — MIDAZOLAM HCL 5 MG/5ML IJ SOLN
INTRAMUSCULAR | Status: DC | PRN
  Administered 2021-02-21: 2 mg via INTRAVENOUS

## 2021-02-21 MED ORDER — FENTANYL CITRATE (PF) 100 MCG/2ML IJ SOLN
50.0000 ug | INTRAMUSCULAR | Status: DC | PRN
  Administered 2021-02-21: 50 ug via INTRAVENOUS

## 2021-02-21 MED ORDER — GABAPENTIN 300 MG PO CAPS: 300.0000 mg | ORAL_CAPSULE | Freq: Three times a day (TID) | ORAL | 0 refills | Status: DC

## 2021-02-21 MED ORDER — ONDANSETRON HCL 4 MG/2ML IJ SOLN
INTRAMUSCULAR | Status: AC
  Filled 2021-02-21: qty 2

## 2021-02-21 MED ORDER — LIDOCAINE HCL (CARDIAC) PF 100 MG/5ML IV SOSY
PREFILLED_SYRINGE | INTRAVENOUS | Status: DC | PRN
  Administered 2021-02-21: 80 mg via INTRATRACHEAL

## 2021-02-21 MED ORDER — PROPOFOL 10 MG/ML IV BOLUS
INTRAVENOUS | Status: AC
  Filled 2021-02-21: qty 40

## 2021-02-21 MED ORDER — ONDANSETRON HCL 4 MG/2ML IJ SOLN
INTRAMUSCULAR | Status: DC | PRN
  Administered 2021-02-21: 4 mg via INTRAVENOUS

## 2021-02-21 MED ORDER — BUPIVACAINE LIPOSOME 1.3 % IJ SUSP
20.0000 mL | Freq: Once | INTRAMUSCULAR | Status: DC
  Filled 2021-02-21: qty 20

## 2021-02-21 MED ORDER — BUPIVACAINE-MELOXICAM ER 200-6 MG/7ML IJ SOLN
INTRAMUSCULAR | Status: DC | PRN
  Administered 2021-02-21: 7 mL

## 2021-02-21 MED ORDER — BUPIVACAINE HCL (PF) 0.5 % IJ SOLN
INTRAMUSCULAR | Status: AC
  Filled 2021-02-21: qty 30

## 2021-02-21 MED ORDER — KETOROLAC TROMETHAMINE 30 MG/ML IJ SOLN
30.0000 mg | Freq: Once | INTRAMUSCULAR | Status: AC
  Administered 2021-02-21: 30 mg via INTRAVENOUS

## 2021-02-21 MED ORDER — KETOROLAC TROMETHAMINE 30 MG/ML IJ SOLN
INTRAMUSCULAR | Status: AC
  Filled 2021-02-21: qty 1

## 2021-02-21 MED ORDER — SCOPOLAMINE 1 MG/3DAYS TD PT72
1.0000 | MEDICATED_PATCH | Freq: Once | TRANSDERMAL | Status: DC
  Administered 2021-02-21: 1.5 mg via TRANSDERMAL

## 2021-02-21 SURGICAL SUPPLY — 47 items
ADH SKN CLS APL DERMABOND .7 (GAUZE/BANDAGES/DRESSINGS) ×2
APL SWBSTK 6 STRL LF DISP (MISCELLANEOUS) ×2
APPLICATOR COTTON TIP 6 STRL (MISCELLANEOUS) ×2 IMPLANT
APPLICATOR COTTON TIP 6IN STRL (MISCELLANEOUS) ×3
BAG HAMPER (MISCELLANEOUS) ×3 IMPLANT
BLADE SURG SZ10 CARB STEEL (BLADE) ×3 IMPLANT
CLOTH BEACON ORANGE TIMEOUT ST (SAFETY) ×3 IMPLANT
COVER LIGHT HANDLE STERIS (MISCELLANEOUS) ×6 IMPLANT
COVER WAND RF STERILE (DRAPES) ×5 IMPLANT
DERMABOND ADVANCED (GAUZE/BANDAGES/DRESSINGS) ×1
DERMABOND ADVANCED .7 DNX12 (GAUZE/BANDAGES/DRESSINGS) ×2 IMPLANT
DRAPE WARM FLUID 44X44 (DRAPES) ×3 IMPLANT
DRSG OPSITE POSTOP 4X8 (GAUZE/BANDAGES/DRESSINGS) ×3 IMPLANT
ELECT REM PT RETURN 9FT ADLT (ELECTROSURGICAL) ×3
ELECTRODE REM PT RTRN 9FT ADLT (ELECTROSURGICAL) ×2 IMPLANT
GAUZE 4X4 16PLY RFD (DISPOSABLE) ×3 IMPLANT
GLOVE ECLIPSE 8.0 STRL XLNG CF (GLOVE) ×3 IMPLANT
GLOVE SRG 8 PF TXTR STRL LF DI (GLOVE) ×2 IMPLANT
GLOVE SURG UNDER POLY LF SZ7 (GLOVE) ×12 IMPLANT
GLOVE SURG UNDER POLY LF SZ8 (GLOVE) ×3
GOWN STRL REUS W/TWL LRG LVL3 (GOWN DISPOSABLE) ×7 IMPLANT
GOWN STRL REUS W/TWL XL LVL3 (GOWN DISPOSABLE) ×3 IMPLANT
HEMOSTAT ARISTA ABSORB 3G PWDR (HEMOSTASIS) ×1 IMPLANT
INST SET MAJOR GENERAL (KITS) ×3 IMPLANT
KIT BLADEGUARD II DBL (SET/KITS/TRAYS/PACK) ×3 IMPLANT
KIT TURNOVER KIT A (KITS) ×3 IMPLANT
MANIFOLD NEPTUNE II (INSTRUMENTS) ×3 IMPLANT
NDL HYPO 18GX1.5 BLUNT FILL (NEEDLE) ×2 IMPLANT
NDL HYPO 21X1.5 SAFETY (NEEDLE) ×2 IMPLANT
NEEDLE HYPO 18GX1.5 BLUNT FILL (NEEDLE) ×3 IMPLANT
NEEDLE HYPO 21X1.5 SAFETY (NEEDLE) ×3 IMPLANT
NS IRRIG 1000ML POUR BTL (IV SOLUTION) ×6 IMPLANT
PACK MAJOR ABDOMINAL (CUSTOM PROCEDURE TRAY) ×3 IMPLANT
PAD ARMBOARD 7.5X6 YLW CONV (MISCELLANEOUS) ×3 IMPLANT
SET BASIN LINEN APH (SET/KITS/TRAYS/PACK) ×3 IMPLANT
SPONGE LAP 18X18 RF (DISPOSABLE) ×6 IMPLANT
SUT CHROMIC 0 CT 1 (SUTURE) ×6 IMPLANT
SUT MON AB 3-0 SH 27 (SUTURE) ×6 IMPLANT
SUT VIC AB 0 CT1 27 (SUTURE) ×9
SUT VIC AB 0 CT1 27XBRD ANTBC (SUTURE) IMPLANT
SUT VIC AB 0 CT1 27XCR 8 STRN (SUTURE) ×6 IMPLANT
SUT VIC AB 0 CTX 36 (SUTURE) ×3
SUT VIC AB 0 CTX36XBRD ANTBCTR (SUTURE) ×2 IMPLANT
SUT VICRYL 3 0 (SUTURE) ×3 IMPLANT
SYR 20ML LL LF (SYRINGE) ×5 IMPLANT
TOWEL SURG RFD BLUE STRL DISP (DISPOSABLE) ×3 IMPLANT
TRAY FOLEY MTR SLVR 16FR STAT (SET/KITS/TRAYS/PACK) ×1 IMPLANT

## 2021-02-21 NOTE — H&P (Signed)
Preoperative History and Physical  Glenda Ray is a 50 y.o. [id number] with Patient's last menstrual period was 02/19/2021. admitted for a abdominal supracervical hysterectomy with removal of both tubes and ovaries.    Patient is aware she has a dramatically enlarged fibroid uterus on both exam and confirmed by sonogram She has had menorrhagia over the years culminating in the need for a blood transfusion through the emergency department At this point the patient and I discussed served of management with megestrol therapy through menopause versus hysterectomy We discussed different hysterectomy options including abdominal supracervical, total abdominal and with and without removal of ovaries She is going to consider options at this point She understands the risk of bleeding which will require another transfusion, postoperative infection including the wound and peritoneum, and injury to other organs such as the bladder ureters and bowel Jocelyn Lamer will consider these options and will communicate with [place] regarding that decision At this point it appears she is leaning toward abdominal supracervical with removal of both tubes and ovaries given her age  PMH:    Past Medical History:  Diagnosis Date  . Anemia   . Fibroids   . Pneumonia 2013    PSH:     Past Surgical History:  Procedure Laterality Date  . LAPAROSCOPIC TUBAL LIGATION      POb/GynH:      OB History    Gravida  2   Para  2   Term  2   Preterm      AB      Living  2     SAB      IAB      Ectopic      Multiple      Live Births  2           SH:   Social History   Tobacco Use  . Smoking status: Never Smoker  . Smokeless tobacco: Never Used  Vaping Use  . Vaping Use: Never used  Substance Use Topics  . Alcohol use: No  . Drug use: No    FH:    Family History  Problem Relation Age of Onset  . Dysfunctional uterine bleeding Mother   . Stroke Mother   . Cancer Paternal Grandfather        lung   . Diabetes Maternal Grandmother   . Heart attack Maternal Grandfather   . Other Father        "stomach erupted"  . Autism Brother   . Cerebral palsy Son   . Seizures Son   . Osteopenia Son   . Crohn's disease Son      Allergies:  Allergies  Allergen Reactions  . Vicodin [Hydrocodone-Acetaminophen] Nausea Only  . Amoxicillin Rash    Medications:       Current Facility-Administered Medications:  .  bupivacaine liposome (EXPAREL) 1.3 % injection 266 mg, 20 mL, Infiltration, Once, Jazzlynn Rawe, Mertie Clause, MD .  chlorhexidine (PERIDEX) 0.12 % solution 15 mL, 15 mL, Mouth/Throat, Once **OR** MEDLINE mouth rinse, 15 mL, Mouth Rinse, Once, Kiel, Coralie Keens, MD .  chlorhexidine (PERIDEX) 0.12 % solution, , , ,  .  clindamycin (CLEOCIN) IVPB 900 mg, 900 mg, Intravenous, 60 min Pre-Op **AND** gentamicin (GARAMYCIN) 380 mg in dextrose 5 % 100 mL IVPB, 5 mg/kg, Intravenous, 60 min Pre-Op, Aakash Hollomon H, MD .  ketorolac (TORADOL) 30 MG/ML injection, , , ,  .  lactated ringers infusion, , Intravenous, Continuous, Kiel, Coralie Keens, MD .  povidone-iodine 10 %  swab 2 application, 2 application, Topical, Once, Fanny Agan, Mertie Clause, MD  Review of Systems:   Review of Systems  Constitutional: Negative for fever, chills, weight loss, malaise/fatigue and diaphoresis.  HENT: Negative for hearing loss, ear pain, nosebleeds, congestion, sore throat, neck pain, tinnitus and ear discharge.   Eyes: Negative for blurred vision, double vision, photophobia, pain, discharge and redness.  Respiratory: Negative for cough, hemoptysis, sputum production, shortness of breath, wheezing and stridor.   Cardiovascular: Negative for chest pain, palpitations, orthopnea, claudication, leg swelling and PND.  Gastrointestinal: Positive for abdominal pain. Negative for heartburn, nausea, vomiting, diarrhea, constipation, blood in stool and melena.  Genitourinary: Negative for dysuria, urgency, frequency, hematuria and flank pain.   Musculoskeletal: Negative for myalgias, back pain, joint pain and falls.  Skin: Negative for itching and rash.  Neurological: Negative for dizziness, tingling, tremors, sensory change, speech change, focal weakness, seizures, loss of consciousness, weakness and headaches.  Endo/Heme/Allergies: Negative for environmental allergies and polydipsia. Does not bruise/bleed easily.  Psychiatric/Behavioral: Negative for depression, suicidal ideas, hallucinations, memory loss and substance abuse. The patient is not nervous/anxious and does not have insomnia.      PHYSICAL EXAM:  Blood pressure 133/77, pulse 96, temperature 98.3 F (36.8 C), resp. rate 14, last menstrual period 02/19/2021, SpO2 100 %.    Vitals reviewed. Constitutional: She is oriented to person, place, and time. She appears well-developed and well-nourished.  HENT:  Head: Normocephalic and atraumatic.  Right Ear: External ear normal.  Left Ear: External ear normal.  Nose: Nose normal.  Mouth/Throat: Oropharynx is clear and moist.  Eyes: Conjunctivae and EOM are normal. Pupils are equal, round, and reactive to light. Right eye exhibits no discharge. Left eye exhibits no discharge. No scleral icterus.  Neck: Normal range of motion. Neck supple. No tracheal deviation present. No thyromegaly present.  Cardiovascular: Normal rate, regular rhythm, normal heart sounds and intact distal pulses.  Exam reveals no gallop and no friction rub.   No murmur heard. Respiratory: Effort normal and breath sounds normal. No respiratory distress. She has no wheezes. She has no rales. She exhibits no tenderness.  GI: Soft. Bowel sounds are normal. She exhibits no distension and no mass. There is tenderness. There is no rebound and no guarding.  Genitourinary:       Vulva is normal without lesions Vagina is pink moist without discharge Cervix normal in appearance and pap is normal Uterus is 20 weeks size Adnexa is negative with normal sized  ovaries by sonogram  Musculoskeletal: Normal range of motion. She exhibits no edema and no tenderness.  Neurological: She is alert and oriented to person, place, and time. She has normal reflexes. She displays normal reflexes. No cranial nerve deficit. She exhibits normal muscle tone. Coordination normal.  Skin: Skin is warm and dry. No rash noted. No erythema. No pallor.  Psychiatric: She has a normal mood and affect. Her behavior is normal. Judgment and thought content normal.    Labs: Results for orders placed or performed during the hospital encounter of 02/19/21 (from the past 336 hour(s))  SARS CORONAVIRUS 2 (TAT 6-24 HRS) Nasopharyngeal Nasopharyngeal Swab   Collection Time: 02/19/21 10:58 AM   Specimen: Nasopharyngeal Swab  Result Value Ref Range   SARS Coronavirus 2 NEGATIVE NEGATIVE  CBC   Collection Time: 02/19/21 10:58 AM  Result Value Ref Range   WBC 10.7 (H) 4.0 - 10.5 K/uL   RBC 6.15 (H) 3.87 - 5.11 MIL/uL   Hemoglobin 14.3 12.0 - 15.0 g/dL  HCT 47.6 (H) 36.0 - 46.0 %   MCV 77.4 (L) 80.0 - 100.0 fL   MCH 23.3 (L) 26.0 - 34.0 pg   MCHC 30.0 30.0 - 36.0 g/dL   RDW Not Measured 11.5 - 15.5 %   Platelets 259 150 - 400 K/uL   nRBC 0.0 0.0 - 0.2 %  Comprehensive metabolic panel   Collection Time: 02/19/21 10:58 AM  Result Value Ref Range   Sodium 139 135 - 145 mmol/L   Potassium 3.9 3.5 - 5.1 mmol/L   Chloride 107 98 - 111 mmol/L   CO2 19 (L) 22 - 32 mmol/L   Glucose, Bld 85 70 - 99 mg/dL   BUN 9 6 - 20 mg/dL   Creatinine, Ser 0.62 0.44 - 1.00 mg/dL   Calcium 9.6 8.9 - 10.3 mg/dL   Total Protein 8.6 (H) 6.5 - 8.1 g/dL   Albumin 4.8 3.5 - 5.0 g/dL   AST 23 15 - 41 U/L   ALT 27 0 - 44 U/L   Alkaline Phosphatase 56 38 - 126 U/L   Total Bilirubin 0.4 0.3 - 1.2 mg/dL   GFR, Estimated >60 >60 mL/min   Anion gap 13 5 - 15  hCG, quantitative, pregnancy   Collection Time: 02/19/21 10:58 AM  Result Value Ref Range   hCG, Beta Chain, Quant, S <1 <5 mIU/mL  Rapid HIV  screen (HIV 1/2 Ab+Ag)   Collection Time: 02/19/21 10:58 AM  Result Value Ref Range   HIV-1 P24 Antigen - HIV24 NON REACTIVE NON REACTIVE   HIV 1/2 Antibodies NON REACTIVE NON REACTIVE   Interpretation (HIV Ag Ab)      A non reactive test result means that HIV 1 or HIV 2 antibodies and HIV 1 p24 antigen were not detected in the specimen.  Urinalysis, Routine w reflex microscopic Urine, Clean Catch   Collection Time: 02/19/21 10:58 AM  Result Value Ref Range   Color, Urine YELLOW YELLOW   APPearance CLEAR CLEAR   Specific Gravity, Urine 1.006 1.005 - 1.030   pH 7.0 5.0 - 8.0   Glucose, UA NEGATIVE NEGATIVE mg/dL   Hgb urine dipstick LARGE (A) NEGATIVE   Bilirubin Urine NEGATIVE NEGATIVE   Ketones, ur NEGATIVE NEGATIVE mg/dL   Protein, ur NEGATIVE NEGATIVE mg/dL   Nitrite NEGATIVE NEGATIVE   Leukocytes,Ua MODERATE (A) NEGATIVE   RBC / HPF >50 (H) 0 - 5 RBC/hpf   WBC, UA 21-50 0 - 5 WBC/hpf   Bacteria, UA RARE (A) NONE SEEN   Squamous Epithelial / LPF 0-5 0 - 5   Mucus PRESENT   Type and screen   Collection Time: 02/19/21 10:58 AM  Result Value Ref Range   ABO/RH(D) A POS    Antibody Screen NEG    Sample Expiration      02/22/2021,2359 Performed at [facility], [address], [place], [place] [address]    Unit Number [id number]    Blood Component Type RED CELLS,LR    Unit division 00    Status of Unit ALLOCATED    Transfusion Status OK TO TRANSFUSE    Crossmatch Result Compatible    Unit Number [id number]    Blood Component Type RED CELLS,LR    Unit division 00    Status of Unit ALLOCATED    Transfusion Status OK TO TRANSFUSE    Crossmatch Result Compatible   BPAM RBC   Collection Time: 02/19/21 10:58 AM  Result Value Ref Range   Blood Product Unit Number [id number]  PRODUCT CODE [id number]    Unit Type and Rh 6200    Blood Product Expiration Date [id number]    Blood Product Unit Number [id number]    PRODUCT CODE [id number]    Unit Type and Rh 6200     Blood Product Expiration Date [id number]     EKG: Orders placed or performed during the hospital encounter of 01/16/21  . EKG 12-Lead  . EKG 12-Lead  . EKG    Imaging Studies: US Venous Img Upper Uni Right  Result Date: 01/20/2021 CLINICAL DATA:  Pain and swelling x1 day EXAM: RIGHT UPPER EXTREMITY VENOUS DOPPLER ULTRASOUND TECHNIQUE: Gray-scale sonography with graded compression, as well as color Doppler and duplex ultrasound were performed to evaluate the upper extremity deep venous system from the level of the subclavian vein and including the jugular, axillary, basilic, radial, ulnar and upper cephalic vein. Spectral Doppler was utilized to evaluate flow at rest and with distal augmentation maneuvers. COMPARISON:  None. FINDINGS: Contralateral IJ vein: Respiratory phasicity is normal and symmetric with the symptomatic side. No evidence of thrombus. Normal compressibility. Internal Jugular Vein: No evidence of thrombus. Normal compressibility, respiratory phasicity and response to augmentation. Subclavian Vein: No evidence of thrombus. Normal compressibility, respiratory phasicity and response to augmentation. Axillary Vein: No evidence of thrombus. Normal compressibility, respiratory phasicity and response to augmentation. Cephalic Vein: Segmental thrombosis by hypoechoic thrombus in the antecubital region, site of recent IV, per technologist. Basilic Vein: No evidence of thrombus. Normal compressibility, respiratory phasicity and response to augmentation. Brachial Veins: No evidence of thrombus. Normal compressibility, respiratory phasicity and response to augmentation. Radial Veins: No evidence of thrombus. Normal compressibility, respiratory phasicity and response to augmentation. Ulnar Veins: No evidence of thrombus. Normal compressibility, respiratory phasicity and response to augmentation. Venous Reflux:  None visualized. Other Findings:  None visualized. IMPRESSION: 1. NEGATIVE for right  upper extremity DVT. 2. Segmental superficial thrombophlebitis of cephalic vein in the region of previous IV placement. Electronically Signed   By: Lucrezia Europe M.D.   On: 01/20/2021 12:24   DG Chest Portable 1 View  Result Date: 01/16/2021 CLINICAL DATA:  Palpitations EXAM: PORTABLE CHEST 1 VIEW COMPARISON:  None. FINDINGS: The heart size and mediastinal contours are within normal limits. Both lungs are clear. No pleural effusion or pneumothorax. The visualized skeletal structures are unremarkable. IMPRESSION: No acute process in the chest. Electronically Signed   By: Macy Mis M.D.   On: 01/16/2021 12:41   US PELVIC COMPLETE WITH TRANSVAGINAL  Result Date: 01/17/2021 CLINICAL DATA:  Dysfunctional uterine bleeding. EXAM: TRANSABDOMINAL AND TRANSVAGINAL ULTRASOUND OF PELVIS TECHNIQUE: Both transabdominal and transvaginal ultrasound examinations of the pelvis were performed. Transabdominal technique was performed for global imaging of the pelvis including uterus, ovaries, adnexal regions, and pelvic cul-de-sac. It was necessary to proceed with endovaginal exam following the transabdominal exam to visualize the uterus and ovaries. COMPARISON:  No prior. FINDINGS: Uterus Measurements: 14.4 x 9.9 x 14.5 cm = volume: 1090.7 mL. A 7.6 and a 6.1 cm fibroid noted. Endometrium Thickness: Not visualized.  No focal abnormality visualized. Right ovary Measurements: 4.0 x 2.0 x 3.0 cm = volume: 12.7 mL. Limited visualization. No focal abnormality identified. Left ovary Measurements: Not visualized. Other findings No abnormal free fluid. Exam limited due to bowel gas and large fibroids. IMPRESSION: 1.  Large fibroids as above. 2. Limited exam due to bowel gas and large fibroids. Endometrium and left ovary not visualized. Limited visualization right ovary. No free fluid. Electronically Signed   By: Marcello Moores  Register   On: 01/17/2021 10:02      Assessment: 20 week size fibroid uterus, 1090 cc on CT  scan Menometrorrhagia resulting in severe anemia, significant improvement with megestrol therapy plus iron  Plan: Abdominal supracervical hysterectomy with removal of both tubes and ovaries  Pt understands the risks of surgery including but not limited t  excessive bleeding requiring transfusion or reoperation, post-operative infection requiring prolonged hospitalization or re-hospitalization and antibiotic therapy, and damage to other organs including bladder, bowel, ureters and major vessels.  The patient also understands the alternative treatment options which were discussed in full.  All questions were answered.  Florian Buff 02/21/2021 7:16 AM   Florian Buff 02/21/2021 7:12 AM

## 2021-02-21 NOTE — Discharge Instructions (Signed)
Post Operative Pain Med Plan:  >Take gabapentin 300 mg three times per day, as prescribed for 4 days, try to space them evenly  >Take the oxycodone 1 tablet, on a schedule, around the clock, every 6 hours(set your phone alarm) for the first 2 days, there may     Be times when you will need 2 tablets but taking them on a schedule will decrease this need  >Then take the oxycodone on an as needed basis per the prescription instructions  >Take the ketorolac or toradol every 8 hours for the first 3 days then the remainder to supplement the oxycodone  >After the toradol is gone then use the ibuprofen as ordered as needed to help supplement the oxycodone  >Use a heating pad as well as needed  >Of course the zofran is available to take as prescribed when needed for nausea  >Be gentle with your diet the first few days, liquids and soft non spicy food, fruits are great  >Get up and move, no lifting or straining  Dr Elonda Husky    Abdominal Hysterectomy, Care After The following information offers guidance on how to care for yourself after your procedure. Your health care provider may also give you more specific instructions. If you have problems or questions, contact your health care provider. What can I expect after the procedure? After the procedure, it is common to have:  Pain.  Tiredness (fatigue).  Poor appetite.  Less interest in sex.  Vaginal bleeding and discharge. You may need to use a sanitary napkin after this procedure.  Constipation.  Feelings of sadness or other emotions. Follow these instructions at home: Medicines  Take over-the-counter and prescription medicines only as told by your health care provider.  Do not take aspirin or NSAIDs, such as ibuprofen. These medicines can cause bleeding.  If you were prescribed an antibiotic medicine, take it as told by your health care provider. Do not stop using the antibiotic even if you start to feel better.  Ask your health  care provider if the medicine prescribed to you: ? Requires you to avoid driving or using machinery. ? Can cause constipation. You may need to take these actions to prevent or treat constipation:  Drink enough fluid to keep your urine pale yellow.  Take over-the-counter or prescription medicines.  Eat foods that are high in fiber, such as beans, whole grains, and fresh fruits and vegetables.  Limit foods that are high in fat and processed sugars, such as fried or sweet foods. Incision care  Follow instructions from your health care provider about how to take care of your incision. Make sure you: ? Wash your hands with soap and water for at least 20 seconds before and after you change your bandage (dressing). If soap and water are not available, use hand sanitizer. ? Change your dressing as told by your health care provider. ? Leave stitches (sutures), skin glue, or adhesive strips in place. These skin closures may need to stay in place for 2 weeks or longer. If adhesive strip edges start to loosen and curl up, you may trim the loose edges. Do not remove adhesive strips completely unless your health care provider tells you to do that.  Keep the dressing dry until your health care provider says it can be removed.  Check your incision area every day for signs of infection. Check for: ? More redness, swelling, or pain. ? Fluid or blood. ? Warmth. ? Pus or a bad smell.   Activity  Rest as told by your health care provider.  Avoid sitting for a long time without moving. Get up to take short walks every 1-2 hours. This is important to improve blood flow and breathing. Ask for help if you feel weak or unsteady.  Do not lift anything that is heavier than 10 lb (4.5 kg), or the limit that you are told, until your health care provider says that it is safe.  Follow instructions from your health care provider about exercise, driving, and general activities.  Return to your normal activities as  told by your health care provider. Ask your health care provider what activities are safe for you.   Lifestyle  Do not douche, use tampons, or have sex for at least 6 weeks or as told by your health care provider.  Do not drink alcohol until your health care provider approves.  Do not use any products that contain nicotine or tobacco. These products include cigarettes, chewing tobacco, and vaping devices, such as e-cigarettes. These can delay healing after surgery. If you need help quitting, ask your health care provider. General instructions  If you struggle with physical or emotional changes after your procedure, speak with your health care provider or a therapist.  Do not take baths, swim, or use a hot tub until your health care provider approves. Ask your health care provider if you may take showers. You may only be allowed to take sponge baths.  Try to have a responsible adult at home with you for the first 1-2 weeks to help with your daily chores.  Wear compression stockings as told by your health care provider. These stockings help to prevent blood clots and reduce swelling in your legs.  Keep all follow-up visits. This is important. Contact a health care provider if:  You have any of these signs of infection: ? Chills or a fever. ? More redness, swelling, warmth, or pain around your incision. ? Fluid or blood coming from your incision. ? Pus or a bad smell coming from your incision.  Your incision opens.  You feel dizzy or light-headed.  You have pain or bleeding when you urinate.  You have diarrhea that does not go away.  You have nausea and vomiting that do not go away.  You have pus, or a bad-smelling discharge coming from your vagina.  You have any type of abnormal reaction such as a rash, or you develop an allergy to your medicine.  Your pain medicine does not help. Get help right away if:  You have a fever and your symptoms suddenly get worse.  You have  severe pain in your abdomen.  You have shortness of breath.  You faint.  You have pain, swelling, or redness in your leg.  You have heavy vaginal bleeding and blood clots, soaking through a sanitary napkin in less than 1 hour. These symptoms may represent a serious problem that is an emergency. Do not wait to see if the symptoms will go away. Get medical help right away. Call your local emergency services (911 in the [place] Do not drive yourself to the hospital. Summary  After your procedure, it is common to have pain, tiredness, and vaginal discharge.  Do not lift anything that is heavier than 10 lb (4.5 kg), or the limit that you are told, until your health care provider says that it is safe.  Follow instructions from your health care provider about exercise, driving, and general activities. Ask what activities are safe for you.  Do not take baths, swim, or use a hot tub until your health care provider approves. Ask your health care provider if you may take showers. You may only be allowed to take sponge baths.  Try to have a responsible adult at home with you for the first 1-2 weeks to help with your daily chores. This information is not intended to replace advice given to you by your health care provider. Make sure you discuss any questions you have with your health care provider. Document Revised: 07/06/2020 Document Reviewed: 07/06/2020 Elsevier Patient Education  2021 [place] Anesthesia, Adult, Care After This sheet gives you information about how to care for yourself after your procedure. Your health care provider may also give you more specific instructions. If you have problems or questions, contact your health care provider. What can I expect after the procedure? After the procedure, the following side effects are common:  Pain or discomfort at the IV site.  Nausea.  Vomiting.  Sore throat.  Trouble concentrating.  Feeling cold or  chills.  Feeling weak or tired.  Sleepiness and fatigue.  Soreness and body aches. These side effects can affect parts of the body that were not involved in surgery. Follow these instructions at home: For the time period you were told by your health care provider:  Rest.  Do not participate in activities where you could fall or become injured.  Do not drive or use machinery.  Do not drink alcohol.  Do not take sleeping pills or medicines that cause drowsiness.  Do not make important decisions or sign legal documents.  Do not take care of children on your own.   Eating and drinking  Follow any instructions from your health care provider about eating or drinking restrictions.  When you feel hungry, start by eating small amounts of foods that are soft and easy to digest (bland), such as toast. Gradually return to your regular diet.  Drink enough fluid to keep your urine pale yellow.  If you vomit, rehydrate by drinking water, juice, or clear broth. General instructions  If you have sleep apnea, surgery and certain medicines can increase your risk for breathing problems. Follow instructions from your health care provider about wearing your sleep device: ? Anytime you are sleeping, including during daytime naps. ? While taking prescription pain medicines, sleeping medicines, or medicines that make you drowsy.  Have a responsible adult stay with you for the time you are told. It is important to have someone help care for you until you are awake and alert.  Return to your normal activities as told by your health care provider. Ask your health care provider what activities are safe for you.  Take over-the-counter and prescription medicines only as told by your health care provider.  If you smoke, do not smoke without supervision.  Keep all follow-up visits as told by your health care provider. This is important. Contact a health care provider if:  You have nausea or vomiting  that does not get better with medicine.  You cannot eat or drink without vomiting.  You have pain that does not get better with medicine.  You are unable to pass urine.  You develop a skin rash.  You have a fever.  You have redness around your IV site that gets worse. Get help right away if:  You have difficulty breathing.  You have chest pain.  You have blood in your urine or stool, or you vomit blood.  Summary  After the procedure, it is common to have a sore throat or nausea. It is also common to feel tired.  Have a responsible adult stay with you for the time you are told. It is important to have someone help care for you until you are awake and alert.  When you feel hungry, start by eating small amounts of foods that are soft and easy to digest (bland), such as toast. Gradually return to your regular diet.  Drink enough fluid to keep your urine pale yellow.  Return to your normal activities as told by your health care provider. Ask your health care provider what activities are safe for you. This information is not intended to replace advice given to you by your health care provider. Make sure you discuss any questions you have with your health care provider. Document Revised: 07/20/2020 Document Reviewed: 02/17/2020 Elsevier Patient Education  2021 [place] PATCH BEHIND YOUR RIGHT EAR ON Saturday February 24, 2021. [place] HANDS AFTER REMOVAL. Scopolamine skin patches What is this medicine? SCOPOLAMINE (skoe POL a meen) is used to prevent nausea and vomiting caused by motion sickness, anesthesia and surgery. This medicine may be used for other purposes; ask your health care provider or pharmacist if you have questions. COMMON BRAND NAME(S): Transderm Scop What should I tell my health care provider before I take this medicine? They need to know if you have any of these conditions:  are scheduled to have a gastric secretion  test  glaucoma  heart disease  kidney disease  liver disease  lung or breathing disease, like asthma  mental illness  prostate disease  seizures  stomach or intestine problems  trouble passing urine  an unusual or allergic reaction to scopolamine, atropine, other medicines, foods, dyes, or preservatives  pregnant or trying to get pregnant  breast-feeding How should I use this medicine? This medicine is for external use only. Follow the directions on the prescription label. Wear only 1 patch at a time. Choose an area behind the ear, that is clean, dry, hairless and free from any cuts or irritation. Wipe the area with a clean dry tissue. Peel off the plastic backing of the skin patch, trying not to touch the adhesive side with your hands. Do not cut the patches. Firmly apply to the area you have chosen, with the metallic side of the patch to the skin and the tan-colored side showing. Once firmly in place, wash your hands well with soap and water. Do not get this medicine into your eyes. After removing the patch, wash your hands and the area behind your ear thoroughly with soap and water. The patch will still contain some medicine after use. To avoid accidental contact or ingestion by children or pets, fold the used patch in half with the sticky side together and throw away in the trash out of the reach of children and pets. If you need to use a second patch after you remove the first, place it behind the other ear. A special MedGuide will be given to you by the pharmacist with each prescription and refill. Be sure to read this information carefully each time. Talk to your pediatrician regarding the use of this medicine in children. Special care may be needed. Overdosage: If you think you have taken too much of this medicine contact a poison control center or emergency room at once. NOTE: This medicine is only for you. Do not share this medicine  with others. What if I miss a  dose? This does not apply. This medicine is not for regular use. What may interact with this medicine?  alcohol  antihistamines for allergy cough and cold  atropine  certain medicines for anxiety or sleep  certain medicines for bladder problems like oxybutynin, tolterodine  certain medicines for depression like amitriptyline, fluoxetine, sertraline  certain medicines for stomach problems like dicyclomine, hyoscyamine  certain medicines for Parkinson's disease like benztropine, trihexyphenidyl  certain medicines for seizures like phenobarbital, primidone  general anesthetics like halothane, isoflurane, methoxyflurane, propofol  ipratropium  local anesthetics like lidocaine, pramoxine, tetracaine  medicines that relax muscles for surgery  phenothiazines like chlorpromazine, mesoridazine, prochlorperazine, thioridazine  narcotic medicines for pain  other belladonna alkaloids This list may not describe all possible interactions. Give your health care provider a list of all the medicines, herbs, non-prescription drugs, or dietary supplements you use. Also tell them if you smoke, drink alcohol, or use illegal drugs. Some items may interact with your medicine. What should I watch for while using this medicine? Limit contact with water while swimming and bathing because the patch may fall off. If the patch falls off, throw it away and put a new one behind the other ear. You may get drowsy or dizzy. Do not drive, use machinery, or do anything that needs mental alertness until you know how this medicine affects you. Do not stand or sit up quickly, especially if you are an older patient. This reduces the risk of dizzy or fainting spells. Alcohol may interfere with the effect of this medicine. Avoid alcoholic drinks. Your mouth may get dry. Chewing sugarless gum or sucking hard candy, and drinking plenty of water may help. Contact your healthcare professional if the problem does not go  away or is severe. This medicine may cause dry eyes and blurred vision. If you wear contact lenses, you may feel some discomfort. Lubricating drops may help. See your healthcare professional if the problem does not go away or is severe. If you are going to need surgery, an MRI, CT scan, or other procedure, tell your healthcare professional that you are using this medicine. You may need to remove the patch before the procedure. What side effects may I notice from receiving this medicine? Side effects that you should report to your doctor or health care professional as soon as possible:  allergic reactions like skin rash, itching or hives; swelling of the face, lips, or tongue  blurred vision  changes in vision  confusion  dizziness  eye pain  fast, irregular heartbeat  hallucinations, loss of contact with reality  nausea, vomiting  pain or trouble passing urine  restlessness  seizures  skin irritation  stomach pain Side effects that usually do not require medical attention (report to your doctor or health care professional if they continue or are bothersome):  drowsiness  dry mouth  headache  sore throat This list may not describe all possible side effects. Call your doctor for medical advice about side effects. You may report side effects to FDA at 1-800-FDA-1088. Where should I keep my medicine? Keep out of the reach of children. Store at room temperature between 20 and 25 degrees C (68 and 77 degrees F). Keep this medicine in the foil package until ready to use. Throw away any unused medicine after the expiration date. NOTE: This sheet is a summary. It may not cover all possible information. If you have questions about this medicine, talk to your doctor, pharmacist,  or health care provider.  2021 Elsevier/Gold Standard (2018-01-23 16:14:46)   PLEASE WEAR THE GOLD [place] Hassan Buckler BRACELET UNTIL Saturday February 24, 2021. DO NOT USE ADDITIONAL NUMBING MEDICATIONS UNTIL  AFTER Saturday.  YOUR SURGEON INJECTED THIS LONG ACTING NUMBING MEDICATION TO HELP CONTROL POSTOPERATIVE PAIN

## 2021-02-21 NOTE — Op Note (Signed)
Preoperative diagnosis:  1.  20 week size fibroid uterus                                          2.  Menorrhagia                                          3.  Anemia, now resolved                                         4.    Postoperative diagnosis:  Same as above   Procedure:  Abdominal hysterectomy, supracervical and removal of both tubes and ovaries  Surgeon:  Florian Buff  Assistant:    Anesthesia:  General endotracheal  Intraoperative findings: 20 week fibroid uterus, normal ovaries, tubes and peritoneum  Description of operation:  Patient was taken to the operating room and placed in the supine position where she underwent general endotracheal anesthesia.  She was then prepped and draped in the usual sterile fashion and a Foley catheter was placed for continuous bladder drainage.  A Pfannenstiel skin incision was made and carried down sharply to the rectus fascia which was scored in the midline and extended laterally.  The fascia was taken off the muscles superiorly and inferiorly without difficulty.  The muscles were divided.  The peritoneal cavity was entered   Both uterine cornu were grasped with Coker clamps.  The left round ligament was suture ligated and coagulated with the electrocautery unit.  The left vesicouterine serosal flap was created.  An avascular window in in the peritoneum was created and the utero-ovarian ligament was cross clamped, cut and suture ligated.  The right round ligament was suture ligated and cut with the electrocautery unit.  The vesicouterine serosal flap on the right was created.  An avascular window in the peritoneum was created and the right utero-ovarian ligament was cross clamped, cut and double suture ligated.  Thus both ovaries were preserved.  The uterine vessels were skeletonized bilaterally.  The uterine vessels were clamped bilaterally,  then cut and suture ligated.  Two more pedicles were taken down the cervix medial to the uterine vessels.  Each  pedicle was clamped cut and suture ligated with good resulting hemostasis.  As per the preoperative plan the cervix was then transected sharply and the specimen was removed.  The cervical stump was then closed anterior to posterior for hemostasis and reduce postoperative adhesions.  The pelvis was irrigated vigorously and all pedicles were examined and found to be hemostatic.  Both Fallpoian tubes and the ovaries were removed by cross clamping with a Kelley clamp and a fore and aft 0 vicryl suture for hemostasis.  All specimens were sent to pathology for routine evaluation.  The Alexis self-retaining retractor was removed and the pelvis was irrigated vigorously.  All packs were removed and all counts were correct at this point x 3.  The muscles and peritoneum were reapproximated loosely. Zynrelef 3 cc was placed on top of the muscle layer. The fascia was closed with 0 Vicryl running.  4 cc of zynrelef was placed on this layer.  The skin was closed using 3-0 Vicryl on a Keith needle in  a subcuticular fashion.  Dermabond was then applied for additional wound integrity and to serve as a postoperative bacterial barrier.  The patient was awakened from anesthesia taken to the recovery room in good stable condition. All sponge instrument and needle counts were correct x 3.  The patient received Ancef and Toradol prophylactically preoperatively.  Estimated blood loss for the procedure was 150  cc.  Florian Buff, MD  02/21/2021 9:35 AM

## 2021-02-21 NOTE — Transfer of Care (Signed)
Immediate Anesthesia Transfer of Care Note  Patient: Glenda Ray  Procedure(s) Performed: HYSTERECTOMY SUPRACERVICAL ABDOMINAL (N/A Abdomen) OPEN BILATERAL SALPINGECTOMY OOPHORECTOMY (Bilateral Abdomen)  Patient Location: PACU  Anesthesia Type:General  Level of Consciousness: awake, alert  and oriented  Airway & Oxygen Therapy: Patient Spontanous Breathing and Patient connected to nasal cannula oxygen  Post-op Assessment: Report given to RN and Post -op Vital signs reviewed and stable  Post vital signs: Reviewed and stable  Last Vitals:  Vitals Value Taken Time  BP    Temp    Pulse    Resp    SpO2      Last Pain:  Vitals:   02/21/21 0700  PainSc: 0-No pain         Complications: No complications documented.

## 2021-02-21 NOTE — Anesthesia Procedure Notes (Signed)
Procedure Name: Intubation Date/Time: 02/21/2021 7:37 AM Performed by: Hewitt Blade, CRNA Pre-anesthesia Checklist: Patient identified, Emergency Drugs available, Suction available and Patient being monitored Patient Re-evaluated:Patient Re-evaluated prior to induction Oxygen Delivery Method: Circle system utilized Preoxygenation: Pre-oxygenation with 100% oxygen Induction Type: IV induction Ventilation: Mask ventilation without difficulty Laryngoscope Size: Mac and 3 Grade View: Grade I Tube type: Oral Tube size: 7.0 mm Number of attempts: 1 Airway Equipment and Method: Stylet and Oral airway Placement Confirmation: ETT inserted through vocal cords under direct vision,  positive ETCO2 and breath sounds checked- equal and bilateral Secured at: 21 cm Tube secured with: Tape Dental Injury: Teeth and Oropharynx as per pre-operative assessment

## 2021-02-21 NOTE — Anesthesia Postprocedure Evaluation (Signed)
Anesthesia Post Note  Patient: Glenda Ray  Procedure(s) Performed: HYSTERECTOMY SUPRACERVICAL ABDOMINAL (N/A Abdomen) OPEN BILATERAL SALPINGECTOMY OOPHORECTOMY (Bilateral Abdomen)  Patient location during evaluation: PACU Anesthesia Type: General Level of consciousness: awake and alert and oriented Pain management: pain level controlled Vital Signs Assessment: post-procedure vital signs reviewed and stable Respiratory status: spontaneous breathing, nonlabored ventilation and respiratory function stable Cardiovascular status: stable Postop Assessment: no apparent nausea or vomiting Anesthetic complications: no   No complications documented.   Last Vitals:  Vitals:   02/21/21 0700 02/21/21 0945  BP: 133/77 (!) 147/80  Pulse: 96 98  Resp: 14   Temp: 36.8 C   SpO2: 100%     Last Pain:  Vitals:   02/21/21 0945  PainSc: [place]

## 2021-02-21 NOTE — Anesthesia Preprocedure Evaluation (Signed)
Anesthesia Evaluation  Patient identified by MRN, date of birth, ID band Patient awake    Reviewed: Allergy & Precautions, H&P , NPO status , Patient's Chart, lab work & pertinent test results, reviewed documented beta blocker date and time   Airway Mallampati: II  TM Distance: >3 FB Neck ROM: full    Dental no notable dental hx.    Pulmonary pneumonia, resolved,    Pulmonary exam normal breath sounds clear to auscultation       Cardiovascular Exercise Tolerance: Good negative cardio ROS   Rhythm:regular Rate:Normal     Neuro/Psych negative neurological ROS  negative psych ROS   GI/Hepatic negative GI ROS, Neg liver ROS,   Endo/Other  negative endocrine ROS  Renal/GU negative Renal ROS  negative genitourinary   Musculoskeletal   Abdominal   Peds  Hematology  (+) Blood dyscrasia, anemia ,   Anesthesia Other Findings   Reproductive/Obstetrics negative OB ROS                             Anesthesia Physical Anesthesia Plan  ASA: II  Anesthesia Plan: General   Post-op Pain Management:    Induction:   PONV Risk Score and Plan: Ondansetron  Airway Management Planned:   Additional Equipment:   Intra-op Plan:   Post-operative Plan:   Informed Consent: I have reviewed the patients History and Physical, chart, labs and discussed the procedure including the risks, benefits and alternatives for the proposed anesthesia with the patient or authorized representative who has indicated his/her understanding and acceptance.     Dental Advisory Given  Plan Discussed with: CRNA  Anesthesia Plan Comments:         Anesthesia Quick Evaluation

## 2021-02-21 NOTE — Progress Notes (Signed)
Call to Patient Husband Richardson Landry with update patient is resting in Recovery Room and attending nurse is managing medications for patient.

## 2021-02-22 ENCOUNTER — Encounter (HOSPITAL_COMMUNITY): Payer: Self-pay | Admitting: Obstetrics & Gynecology

## 2021-02-22 LAB — SURGICAL PATHOLOGY

## 2021-02-23 LAB — TYPE AND SCREEN
ABO/RH(D): A POS
Antibody Screen: NEGATIVE
Unit division: 0
Unit division: 0

## 2021-02-23 LAB — BPAM RBC
Blood Product Expiration Date: 202204272359
Blood Product Expiration Date: 202205062359
Unit Type and Rh: 6200
Unit Type and Rh: 6200

## 2021-02-26 ENCOUNTER — Telehealth: Payer: Self-pay

## 2021-02-26 NOTE — Telephone Encounter (Signed)
Pt called after hours nurse on 02/24/2021 stating that she had not had a BM since having her hysterectomy on 02/21/2021, she also stated that she has been taking Miralax, no abd pain and taking half doses of Oxycodone, and gabapentin hourly. Please advise

## 2021-02-26 NOTE — Telephone Encounter (Signed)
Pt called stating that she sent a MyChart message about medication for nausea and vomiting.  She stated that the Ondansetron medication makes her sick and also that she has a previous medication promethazine Rx and is wondering if she could take that and/or if something else would or could be sent in to the pharmacy.

## 2021-03-05 ENCOUNTER — Ambulatory Visit (INDEPENDENT_AMBULATORY_CARE_PROVIDER_SITE_OTHER): Payer: Self-pay | Admitting: Obstetrics & Gynecology

## 2021-03-05 ENCOUNTER — Encounter: Payer: Self-pay | Admitting: Obstetrics & Gynecology

## 2021-03-05 ENCOUNTER — Other Ambulatory Visit: Payer: Self-pay

## 2021-03-05 VITALS — BP 141/84 | HR 91 | Ht 61.0 in | Wt 154.0 lb

## 2021-03-05 DIAGNOSIS — Z9889 Other specified postprocedural states: Secondary | ICD-10-CM

## 2021-03-05 DIAGNOSIS — Z862 Personal history of diseases of the blood and blood-forming organs and certain disorders involving the immune mechanism: Secondary | ICD-10-CM

## 2021-03-05 LAB — POCT HEMOGLOBIN: Hemoglobin: 13.3 g/dL (ref 11–14.6)

## 2021-03-05 NOTE — Progress Notes (Signed)
  HPI: Patient returns for routine postoperative follow-up having undergone Abdominal supracervical hysterectomy BSO on 02/21/21.  The patient's immediate postoperative recovery has been unremarkable. Since hospital discharge the patient reports no problems.   Current Outpatient Medications: famotidine (PEPCID) 20 MG tablet, Take 20 mg by mouth daily as needed (acid reflux)., Disp: , Rfl:   No current facility-administered medications for this visit.    Blood pressure (!) 141/84, pulse 91, height 5\' 1"  (1.549 m), weight 154 lb (69.9 kg), last menstrual period 02/19/2021.  Physical Exam: Normal exam Incision clean dry intact  Diagnostic Tests:   Pathology: benign  Impression: 1.    ICD-10-CM   1. S/P Abdominal supracervical hysterectomy BSO 02/21/21  Z98.890   2. History of anemia  Z86.2 POCT hemoglobin        Plan: 4 weeks    Follow up: Return in about 4 weeks (around 04/02/2021) for Post Op, with Dr Elonda Husky.    Florian Buff, MD

## 2021-04-02 ENCOUNTER — Other Ambulatory Visit: Payer: Self-pay | Admitting: Obstetrics & Gynecology

## 2021-04-02 ENCOUNTER — Encounter: Payer: Self-pay | Admitting: Obstetrics & Gynecology

## 2021-04-02 ENCOUNTER — Other Ambulatory Visit: Payer: Self-pay

## 2021-04-02 ENCOUNTER — Ambulatory Visit (INDEPENDENT_AMBULATORY_CARE_PROVIDER_SITE_OTHER): Payer: Self-pay | Admitting: Obstetrics & Gynecology

## 2021-04-02 VITALS — BP 127/82 | HR 89 | Ht 61.0 in | Wt 156.5 lb

## 2021-04-02 DIAGNOSIS — Z9889 Other specified postprocedural states: Secondary | ICD-10-CM

## 2021-04-02 MED ORDER — ESTRADIOL 2 MG PO TABS: 2.0000 mg | ORAL_TABLET | Freq: Every day | ORAL | 11 refills | Status: AC

## 2021-04-02 NOTE — Progress Notes (Signed)
  HPI: Patient returns for routine postoperative follow-up having undergone abdominal supracervical hysterectomy BSO on 02/21/21.  The patient's immediate postoperative recovery has been unremarkable. Since hospital discharge the patient reports no problems.   Current Outpatient Medications: famotidine (PEPCID) 20 MG tablet, Take 20 mg by mouth daily as needed (acid reflux)., Disp: , Rfl:   No current facility-administered medications for this visit.    Blood pressure 127/82, pulse 89, height 5\' 1"  (1.549 m), weight 156 lb 8 oz (71 kg), last menstrual period 02/19/2021.  Physical Exam: Abdomen soft non tender normal post op Incision clean dry intact  Diagnostic Tests:   Pathology: benign  Impression:   ICD-10-CM   1. S/P Abdominal supracervical hysterectomy BSO 02/21/21  Z98.890       Plan: Follow up 3 years    Follow up: Return in about 3 years (around 04/02/2024) for yearly.    Florian Buff, MD

## 2022-12-23 IMAGING — DX DG CHEST 1V PORT
1 series · 1 of 1 positions shown · non-contrast
Comparison: None.

CLINICAL DATA: Palpitations

EXAM:
PORTABLE CHEST 1 VIEW

[chest ap]
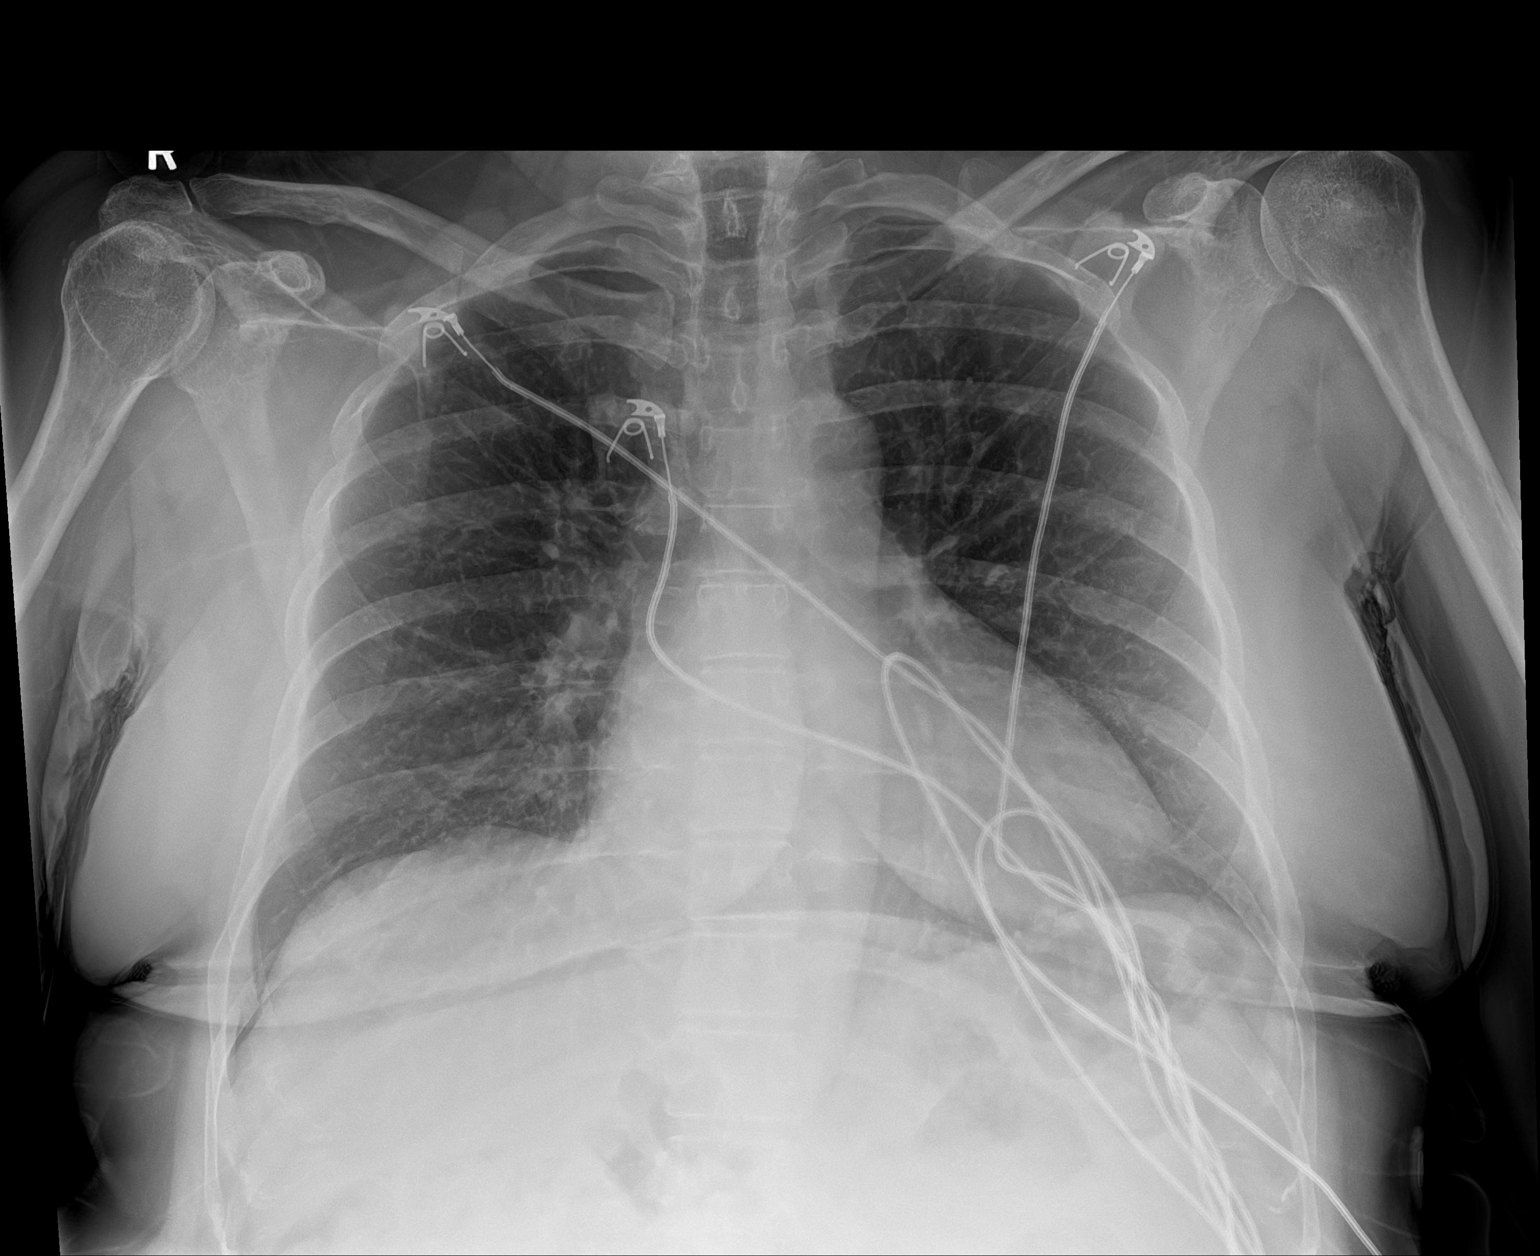

[1 of 1 positions shown; findings below may reference images not displayed]

FINDINGS: The heart size and mediastinal contours are within normal limits.
Both lungs are clear. No pleural effusion or pneumothorax. The
visualized skeletal structures are unremarkable.
IMPRESSION: No acute process in the chest.
